# Patient Record
Sex: Female | Born: 1990
Health system: Southern US, Community
[De-identification: ages and names within clinical notes are randomized; demographics above are authoritative.]

## PROBLEM LIST (undated history)

## (undated) ENCOUNTER — Inpatient Hospital Stay: Payer: Self-pay

## (undated) DIAGNOSIS — D649 Anemia, unspecified: Secondary | ICD-10-CM

## (undated) DIAGNOSIS — E785 Hyperlipidemia, unspecified: Secondary | ICD-10-CM

## (undated) DIAGNOSIS — F32A Depression, unspecified: Secondary | ICD-10-CM

## (undated) DIAGNOSIS — O24419 Gestational diabetes mellitus in pregnancy, unspecified control: Secondary | ICD-10-CM

## (undated) DIAGNOSIS — G4700 Insomnia, unspecified: Secondary | ICD-10-CM

## (undated) DIAGNOSIS — F329 Major depressive disorder, single episode, unspecified: Secondary | ICD-10-CM

## (undated) HISTORY — DX: Hyperlipidemia, unspecified: E78.5

## (undated) HISTORY — DX: Depression, unspecified: F32.A

## (undated) HISTORY — DX: Insomnia, unspecified: G47.00

## (undated) HISTORY — DX: Major depressive disorder, single episode, unspecified: F32.9

## (undated) HISTORY — DX: Anemia, unspecified: D64.9

## (undated) HISTORY — PX: TONSILLECTOMY: SUR1361

## (undated) HISTORY — PX: TONSILLECTOMY: SHX5217

## (undated) HISTORY — PX: CHOLECYSTECTOMY: SHX55

## (undated) SURGERY — Surgical Case
Anesthesia: Spinal | Laterality: Bilateral

---
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
## 2008-03-14 ENCOUNTER — Observation Stay: Payer: Self-pay | Admitting: Unknown Physician Specialty

## 2008-03-17 ENCOUNTER — Ambulatory Visit: Payer: Self-pay | Admitting: Obstetrics & Gynecology

## 2008-03-21 ENCOUNTER — Inpatient Hospital Stay: Payer: Self-pay | Admitting: Obstetrics & Gynecology

## 2009-04-08 ENCOUNTER — Emergency Department: Payer: Self-pay | Admitting: Emergency Medicine

## 2009-04-29 ENCOUNTER — Emergency Department: Payer: Self-pay | Admitting: Emergency Medicine

## 2009-05-18 ENCOUNTER — Ambulatory Visit: Payer: Self-pay | Admitting: Surgery

## 2009-05-22 ENCOUNTER — Ambulatory Visit: Payer: Self-pay | Admitting: Surgery

## 2009-10-05 ENCOUNTER — Emergency Department (HOSPITAL_COMMUNITY): Admission: EM | Admit: 2009-10-05 | Discharge: 2009-10-06 | Payer: Self-pay | Admitting: Emergency Medicine

## 2010-10-07 LAB — DIFFERENTIAL
Basophils Absolute: 0.2 10*3/uL — ABNORMAL HIGH (ref 0.0–0.1)
Basophils Relative: 1 % (ref 0–1)
Eosinophils Absolute: 0.4 10*3/uL (ref 0.0–0.7)
Eosinophils Relative: 3 % (ref 0–5)
Lymphs Abs: 3.3 10*3/uL (ref 0.7–4.0)
Monocytes Absolute: 0.6 10*3/uL (ref 0.1–1.0)
Monocytes Relative: 4 % (ref 3–12)
Neutrophils Relative %: 68 % (ref 43–77)

## 2010-10-07 LAB — URINALYSIS, ROUTINE W REFLEX MICROSCOPIC
Bilirubin Urine: NEGATIVE
Glucose, UA: NEGATIVE mg/dL
Leukocytes, UA: NEGATIVE
Nitrite: NEGATIVE
Specific Gravity, Urine: 1.021 (ref 1.005–1.030)
pH: 6 (ref 5.0–8.0)

## 2010-10-07 LAB — RHOGAM INJECTION

## 2010-10-07 LAB — RPR: RPR Ser Ql: NONREACTIVE

## 2010-10-07 LAB — CBC: WBC: 13.9 10*3/uL — ABNORMAL HIGH (ref 4.0–10.5)

## 2010-10-07 LAB — GC/CHLAMYDIA PROBE AMP, GENITAL: Chlamydia, DNA Probe: NEGATIVE

## 2010-10-07 LAB — HCG, QUANTITATIVE, PREGNANCY: hCG, Beta Chain, Quant, S: 479 m[IU]/mL — ABNORMAL HIGH (ref <5)

## 2010-10-07 LAB — WET PREP, GENITAL: Yeast Wet Prep HPF POC: NONE SEEN

## 2010-10-07 LAB — URINE MICROSCOPIC-ADD ON

## 2011-11-24 ENCOUNTER — Emergency Department: Payer: Self-pay | Admitting: *Deleted

## 2011-11-24 LAB — URINALYSIS, COMPLETE
Bacteria: NONE SEEN
Bilirubin,UR: NEGATIVE
Glucose,UR: NEGATIVE mg/dL (ref 0–75)
Ketone: NEGATIVE
Leukocyte Esterase: NEGATIVE
Squamous Epithelial: 4
WBC UR: 4 /HPF (ref 0–5)

## 2011-11-24 LAB — COMPREHENSIVE METABOLIC PANEL
Albumin: 4.1 g/dL (ref 3.4–5.0)
BUN: 11 mg/dL (ref 7–18)
Calcium, Total: 8.4 mg/dL — ABNORMAL LOW (ref 8.5–10.1)
Chloride: 105 mmol/L (ref 98–107)
Glucose: 128 mg/dL — ABNORMAL HIGH (ref 65–99)
Potassium: 3.4 mmol/L — ABNORMAL LOW (ref 3.5–5.1)
SGOT(AST): 21 U/L (ref 15–37)
Sodium: 140 mmol/L (ref 136–145)
Total Protein: 7.9 g/dL (ref 6.4–8.2)

## 2011-11-24 LAB — CBC
HGB: 13.9 g/dL (ref 12.0–16.0)
MCV: 94 fL (ref 80–100)
Platelet: 229 10*3/uL (ref 150–440)
RDW: 13 % (ref 11.5–14.5)
WBC: 11.6 10*3/uL — ABNORMAL HIGH (ref 3.6–11.0)

## 2011-11-24 LAB — PREGNANCY, URINE: Pregnancy Test, Urine: NEGATIVE m[IU]/mL

## 2011-11-24 LAB — LIPASE, BLOOD: Lipase: 372 U/L (ref 73–393)

## 2012-03-17 ENCOUNTER — Emergency Department: Payer: Self-pay | Admitting: Emergency Medicine

## 2012-03-17 LAB — CBC
HCT: 43.1 % (ref 35.0–47.0)
MCH: 31.3 pg (ref 26.0–34.0)
MCV: 93 fL (ref 80–100)
Platelet: 233 10*3/uL (ref 150–440)
RDW: 13.4 % (ref 11.5–14.5)
WBC: 13.1 10*3/uL — ABNORMAL HIGH (ref 3.6–11.0)

## 2012-03-17 LAB — BASIC METABOLIC PANEL
BUN: 14 mg/dL (ref 7–18)
EGFR (Non-African Amer.): >60
Glucose: 89 mg/dL (ref 65–99)
Potassium: 3.7 mmol/L (ref 3.5–5.1)
Sodium: 140 mmol/L (ref 136–145)

## 2012-03-17 LAB — HEPATIC FUNCTION PANEL A (ARMC)
Albumin: 4.2 g/dL (ref 3.4–5.0)
Alkaline Phosphatase: 72 U/L (ref 50–136)
Bilirubin, Direct: 0.1 mg/dL (ref 0.00–0.20)
Bilirubin,Total: 0.7 mg/dL (ref 0.2–1.0)
Total Protein: 8.3 g/dL — ABNORMAL HIGH (ref 6.4–8.2)

## 2012-03-17 LAB — LIPASE, BLOOD: Lipase: 219 U/L (ref 73–393)

## 2012-03-17 LAB — URINALYSIS, COMPLETE
Bacteria: NONE SEEN
Bilirubin,UR: NEGATIVE
Ketone: NEGATIVE
Leukocyte Esterase: NEGATIVE
Nitrite: NEGATIVE
Protein: NEGATIVE
RBC,UR: 3 /HPF (ref 0–5)
Specific Gravity: 1.027 (ref 1.003–1.030)
WBC UR: 3 /HPF (ref 0–5)

## 2014-10-22 ENCOUNTER — Emergency Department
Admit: 2014-10-22 | Disposition: A | Discharge status: Home / Self Care | Payer: Self-pay | Admitting: Emergency Medicine

## 2014-10-22 LAB — CBC
HCT: 41.5 % (ref 35.0–47.0)
HGB: 13.7 g/dL (ref 12.0–16.0)
MCH: 30.5 pg (ref 26.0–34.0)
MCHC: 33.1 g/dL (ref 32.0–36.0)
MCV: 92 fL (ref 80–100)
PLATELETS: 208 10*3/uL (ref 150–440)
RBC: 4.5 10*6/uL (ref 3.80–5.20)
RDW: 13 % (ref 11.5–14.5)
WBC: 12.4 10*3/uL — AB (ref 3.6–11.0)

## 2014-10-22 LAB — HCG, QUANTITATIVE, PREGNANCY: BETA HCG, QUANT.: 23338 m[IU]/mL — AB

## 2015-04-20 ENCOUNTER — Encounter: Payer: Self-pay | Admitting: *Deleted

## 2015-04-20 ENCOUNTER — Encounter: Payer: Medicaid Other | Attending: Certified Nurse Midwife | Admitting: *Deleted

## 2015-04-20 VITALS — BP 100/68 | Ht 59.0 in | Wt 184.6 lb

## 2015-04-20 DIAGNOSIS — O24419 Gestational diabetes mellitus in pregnancy, unspecified control: Secondary | ICD-10-CM | POA: Diagnosis present

## 2015-04-20 DIAGNOSIS — O24410 Gestational diabetes mellitus in pregnancy, diet controlled: Secondary | ICD-10-CM

## 2015-04-20 NOTE — Patient Instructions (Signed)
Read booklet on Gestational Diabetes Follow Gestational Meal Planning Guidelines Avoid cold cereal for breakfast Complete a 3 Day Food Record and bring to next appointment Check blood sugars 4 x day - before breakfast and 2 hrs after every meal and record  Call MD for prescription for meter strips and lancets Strips Accu-Chek Aviva Lancets   Accu-Chek Fastclix Bring blood sugar log to next appointment If blood sugars not within guidelines - purchase urine ketone strips and check urine ketones every am:  If + increase bedtime snack to 1 protein and 2 carbohydrate servings Walk 20-30 minutes at least 5 x week if permitted by MD

## 2015-04-20 NOTE — Progress Notes (Signed)
Diabetes Self-Management Education  Visit Type: First/Initial  Appt. Start Time: 0820 Appt. End Time: 1005  04/20/2015  Ms. Rebecca Ponce, identified by name and date of birth, is a 24 y.o. female with a diagnosis of Diabetes: Gestational Diabetes.   ASSESSMENT  Blood pressure 100/68, height  (1.499 m), weight 184 lb 9.6 oz (83.734 kg), last menstrual period 09/12/2014. Body mass index is 37.26 kg/(m^2).      Diabetes Self-Management Education - 04/20/15 1213    Visit Information   Visit Type First/Initial   Initial Visit   Diabetes Type Gestational Diabetes   Are you currently following a meal plan? No   Are you taking your medications as prescribed? Yes   Date Diagnosed last week   Health Coping   How would you rate your overall health? Fair   Psychosocial Assessment   Patient Belief/Attitude about Diabetes Other (comment)  pt reports "figured I would have diabetes with my family history"   Self-care barriers None   Self-management support Family;Doctor's office   Patient Concerns Nutrition/Meal planning;Weight Control;Healthy Lifestyle;Monitoring   Special Needs None   Preferred Learning Style Hands on;Visual   Learning Readiness Ready   How often do you need to have someone help you when you read instructions, pamphlets, or other written materials from your doctor or pharmacy? 1 - Never   Complications   How often do you check your blood sugar? 0 times/day (not testing)  Provided Accu-Chek Aviva Connect meter and instructed on use. BG upon return demonstration was 93 mg/dL at 16:10 am - 4 hrs pp.   Have you had a dilated eye exam in the past 12 months? No   Have you had a dental exam in the past 12 months? No   Are you checking your feet? No   Dietary Intake   Breakfast oatmeal, grits, cold cereal, eggs and sausage   Lunch sandwich   Snack (afternoon) granola bar   Dinner chicken, spaghetti, pork chop, hot dogs, hamburgers, corn, peas, potatoes   Beverage(s)  water   Exercise   Exercise Type ADL's   Patient Education   Previous Diabetes Education Yes (please comment)  from her mother whom has diabetes   Disease state  Definition of diabetes, type 1 and 2, and the diagnosis of diabetes;Factors that contribute to the development of diabetes   Nutrition management  Role of diet in the treatment of diabetes and the relationship between the three main macronutrients and blood glucose level   Physical activity and exercise  Role of exercise on diabetes management, blood pressure control and cardiac health.   Monitoring Taught/evaluated SMBG meter.;Purpose and frequency of SMBG.;Identified appropriate SMBG and/or A1C goals.   Chronic complications Relationship between chronic complications and blood glucose control   Psychosocial adjustment Identified and addressed patients feelings and concerns about diabetes   Preconception care Pregnancy and GDM  Role of pre-pregnancy blood glucose control on the development of the fetus;Reviewed with patient blood glucose goals with pregnancy   Individualized Goals (developed by patient)   Reducing Risk Improve blood sugars Lose weight Lead a healthier lifestyle Become more fit   Outcomes   Expected Outcomes Demonstrated interest in learning. Expect positive outcomes      Individualized Plan for Diabetes Self-Management Training:   Learning Objective:  Patient will have a greater understanding of diabetes self-management. Patient education plan is to attend individual and/or group sessions per assessed needs and concerns.   Plan:   Patient Instructions  Read booklet on  Gestational Diabetes Follow Gestational Meal Planning Guidelines Avoid cold cereal for breakfast Complete a 3 Day Food Record and bring to next appointment Check blood sugars 4 x day - before breakfast and 2 hrs after every meal and record  Call MD for prescription for meter strips and lancets Strips Accu-Chek Aviva Lancets   Accu-Chek  Fastclix Bring blood sugar log to next appointment If blood sugars not within guidelines - purchase urine ketone strips and check urine ketones every am:  If + increase bedtime snack to 1 protein and 2 carbohydrate servings Walk 20-30 minutes at least 5 x week if permitted by MD   Expected Outcomes:  Demonstrated interest in learning. Expect positive outcomes  Education material provided:  Gestational Booklet Gestational Meal Planning Guidelines Viewed Gestational Diabetes Video Meter - Accu-Chek Aviva Connect 3 Day Food Record Goals for a Healthy Pregnancy   If problems or questions, patient to contact team via:   Sharion Settler, RN, CCM, CDE [phone]  Future DSME appointment:  Tuesday April 24, 2015 with Pam (dietitian)

## 2015-04-24 ENCOUNTER — Encounter: Payer: Medicaid Other | Admitting: Dietician

## 2015-04-24 VITALS — BP 120/64 | Ht 59.0 in | Wt 185.3 lb

## 2015-04-24 DIAGNOSIS — O24410 Gestational diabetes mellitus in pregnancy, diet controlled: Secondary | ICD-10-CM

## 2015-04-24 DIAGNOSIS — O24419 Gestational diabetes mellitus in pregnancy, unspecified control: Secondary | ICD-10-CM | POA: Diagnosis not present

## 2015-04-24 NOTE — Patient Instructions (Signed)
   Carefully control carbohydrate portions with meals, keep to 3 servings at a time (2 at breakfast), which is 30-45grams.   Include protein and "free" veggies to make sure you get enough to feel satisfied after eating.

## 2015-04-24 NOTE — Progress Notes (Signed)
   Patient's BG record indicates BGs elevated: FBGs ranging 97-112, and post-meal BGs 75-158.   Patient's food diary indicates some high-carb meals, otherwise well-balanced.   Provided 1700kcal meal plan, and wrote individualized menus based on patient's food preferences.   Advised patient to control carbohydrate portions, and to increase low-carb vegetables and protein portions if needed to control hunger.   Instructed patient on food safety, including avoidance of Listeriosis, and limiting mercury from fish (patient states she does not eat fish).  Discussed importance of maintaining healthy lifestyle habits to reduce risk of Type 2 DM as well as Gestational DM with any future pregnancies.  Advised patient to use any remaining testing supplies to test some BGs after delivery, and to have BG tested ideally annually, as well as prior to attempting future pregnancies.

## 2015-05-25 ENCOUNTER — Inpatient Hospital Stay
Admission: EM | Admit: 2015-05-25 | Discharge: 2015-05-25 | Disposition: A | Discharge status: Home / Self Care | Payer: Medicaid Other | Attending: Obstetrics and Gynecology | Admitting: Obstetrics and Gynecology

## 2015-05-25 ENCOUNTER — Encounter: Payer: Self-pay | Admitting: *Deleted

## 2015-05-25 HISTORY — DX: Gestational diabetes mellitus in pregnancy, unspecified control: O24.419

## 2015-05-25 NOTE — Progress Notes (Signed)
Pt. Discharge to home with family at the bedside. Pt. Pain 0/10, unaware of intermittent contractions. FHR with baseline of 155 bpm. Pt. Verbalized understanding of all discharge instructions, please return to hospital if temp 100.4 or greater, vaginal bleeding, SROM or decreased fetal movement.  Pt. Verbalized understanding.

## 2015-05-25 NOTE — OB Triage Note (Signed)
Pt. Sent from the office by Dr. Vergie LivingPickens for NST - "office NST was taking too long".

## 2015-05-25 NOTE — Progress Notes (Signed)
L&D Triage Note  24 year old G4 P2012 with EDC=06/19/2015 by LMP +5wk6d ultrasound presents from office at 36.3 weeks for NST. In office NST for GDMA2 was non reactive. [facility] also remarkable for prior Cesarean sections x 2 and has A negative blood type.   Exam: General : in no acute distress BP 133/70 mmHg  Pulse 114  Temp(Src) 98.8 F (37.1 C) (Oral)  Resp 18  Ht 4\' 11"  (1.499 m)  Wt 192 lb (87.091 kg)  BMI 38.76 kg/m2  LMP 09/12/2014 (Approximate)   FHR 155 with accels to 180s, moderate variability Toco: occasional contractions  A: IUP at 36.3 weeks with nicely reactive NST  P: DC home Daily FKCs NST/AFI as scheduled on 15 November.  Farrel ConnersGUTIERREZ, Rebecca Ponce, CNM

## 2015-06-01 ENCOUNTER — Observation Stay
Admission: EM | Admit: 2015-06-01 | Discharge: 2015-06-01 | Disposition: A | Discharge status: Home / Self Care | Payer: Medicaid Other | Attending: Obstetrics and Gynecology | Admitting: Obstetrics and Gynecology

## 2015-06-01 DIAGNOSIS — Z3A37 37 weeks gestation of pregnancy: Secondary | ICD-10-CM | POA: Insufficient documentation

## 2015-06-01 DIAGNOSIS — O288 Other abnormal findings on antenatal screening of mother: Secondary | ICD-10-CM | POA: Diagnosis present

## 2015-06-01 NOTE — OB Triage Note (Signed)
Arrived per Wc to Obs 3 c/o nonreactive NST in office today.  Changed to gown and to bed.  EFM applied.  Isntructions given concerning NST.  Demonstrated understanding.  Oriented to room and plan of care discussed.  Agrees with plan of care.

## 2015-06-01 NOTE — OB Triage Note (Signed)
Pt arrived for NST and changed into gown. Pt hooked up to US and Toco and given instructions about monitoring. Pt verbalizes understanding.

## 2015-06-01 NOTE — Plan of Care (Signed)
Discharge instructions given and explained.  Questions answered.  Signed copy in hand and one on chart.  Discharged in stable and ambulatory condition.

## 2015-06-01 NOTE — Progress Notes (Signed)
L&D Triage Note  24 year old G4 P2012 with EDC=06/19/2015 by LMP +5wk6d ultrasound presents from office at 37.3 weeks for NST. In office NST for GDMA2 was non reactive. [facility] for prior Cesarean sections x 2 and has A negative blood type.   Exam: General : in no acute distress, not feeling contractions  FHR 155 with accels to 170s, moderate variability Toco:irregular, mild contractions  A: IUP at 37.3 weeks with nicely reactive NST  P: DC home Daily FKCs NST/growth scan as scheduled on 22 November. Repeat CS scheduled on 29 November  Farrel ConnersGUTIERREZ, Rebecca Ponce, [place]

## 2015-06-11 ENCOUNTER — Inpatient Hospital Stay: Payer: Medicaid Other | Admitting: Anesthesiology

## 2015-06-11 ENCOUNTER — Inpatient Hospital Stay
Admission: EM | Admit: 2015-06-11 | Discharge: 2015-06-14 | DRG: 766 | Disposition: A | Discharge status: Home / Self Care | Payer: Medicaid Other | Attending: Obstetrics and Gynecology | Admitting: Obstetrics and Gynecology

## 2015-06-11 ENCOUNTER — Encounter
Admission: EM | Disposition: A | Discharge status: Home / Self Care | Payer: Self-pay | Source: Home / Self Care | Attending: Obstetrics and Gynecology

## 2015-06-11 ENCOUNTER — Encounter
Admission: RE | Admit: 2015-06-11 | Discharge: 2015-06-11 | Disposition: A | Discharge status: Home / Self Care | Payer: Medicaid Other | Source: Ambulatory Visit | Attending: Obstetrics & Gynecology | Admitting: Obstetrics & Gynecology

## 2015-06-11 DIAGNOSIS — Z302 Encounter for sterilization: Secondary | ICD-10-CM | POA: Diagnosis not present

## 2015-06-11 DIAGNOSIS — O34211 Maternal care for low transverse scar from previous cesarean delivery: Secondary | ICD-10-CM | POA: Diagnosis present

## 2015-06-11 DIAGNOSIS — O24415 Gestational diabetes mellitus in pregnancy, controlled by oral hypoglycemic drugs: Secondary | ICD-10-CM | POA: Diagnosis present

## 2015-06-11 DIAGNOSIS — R21 Rash and other nonspecific skin eruption: Secondary | ICD-10-CM | POA: Diagnosis present

## 2015-06-11 DIAGNOSIS — Z3A39 39 weeks gestation of pregnancy: Secondary | ICD-10-CM

## 2015-06-11 DIAGNOSIS — O24429 Gestational diabetes mellitus in childbirth, unspecified control: Secondary | ICD-10-CM | POA: Diagnosis present

## 2015-06-11 DIAGNOSIS — O4292 Full-term premature rupture of membranes, unspecified as to length of time between rupture and onset of labor: Secondary | ICD-10-CM | POA: Diagnosis present

## 2015-06-11 DIAGNOSIS — Z98891 History of uterine scar from previous surgery: Secondary | ICD-10-CM

## 2015-06-11 LAB — CBC
HCT: 43.9 % (ref 35.0–47.0)
Hemoglobin: 14.4 g/dL (ref 12.0–16.0)
MCH: 30.5 pg (ref 26.0–34.0)
MCHC: 32.8 g/dL (ref 32.0–36.0)
MCV: 93 fL (ref 80.0–100.0)
PLATELETS: 128 10*3/uL — AB (ref 150–440)
RBC: 4.73 MIL/uL (ref 3.80–5.20)
RDW: 14.2 % (ref 11.5–14.5)
WBC: 11.3 10*3/uL — ABNORMAL HIGH (ref 3.6–11.0)

## 2015-06-11 LAB — TYPE AND SCREEN
ABO/RH(D): A NEG
ANTIBODY SCREEN: NEGATIVE
Extend sample reason: UNDETERMINED

## 2015-06-11 LAB — ABO/RH: ABO/RH(D): A NEG

## 2015-06-11 SURGERY — Surgical Case
Anesthesia: Spinal

## 2015-06-11 MED ORDER — PHENYLEPHRINE HCL 10 MG/ML IJ SOLN
INTRAMUSCULAR | Status: DC | PRN
  Administered 2015-06-11: 100 ug via INTRAVENOUS

## 2015-06-11 MED ORDER — EPHEDRINE SULFATE 50 MG/ML IJ SOLN
INTRAMUSCULAR | Status: DC | PRN
  Administered 2015-06-11 (×2): 10 mg via INTRAVENOUS

## 2015-06-11 MED ORDER — CITRIC ACID-SODIUM CITRATE 334-500 MG/5ML PO SOLN: 30.0000 mL | ORAL | Status: DC

## 2015-06-11 MED ORDER — KETOROLAC TROMETHAMINE 30 MG/ML IJ SOLN
30.0000 mg | Freq: Four times a day (QID) | INTRAMUSCULAR | Status: AC | PRN
  Administered 2015-06-12: 30 mg via INTRAVENOUS
  Filled 2015-06-11: qty 1

## 2015-06-11 MED ORDER — KETOROLAC TROMETHAMINE 30 MG/ML IJ SOLN: 30.0000 mg | Freq: Four times a day (QID) | INTRAMUSCULAR | Status: AC | PRN

## 2015-06-11 MED ORDER — CEFAZOLIN SODIUM-DEXTROSE 2-3 GM-% IV SOLR
INTRAVENOUS | Status: AC
  Administered 2015-06-11: 2 g via INTRAVENOUS
  Filled 2015-06-11: qty 50

## 2015-06-11 MED ORDER — PHENYLEPHRINE HCL 10 MG/ML IJ SOLN: INTRAMUSCULAR | Status: DC | PRN

## 2015-06-11 MED ORDER — CEFAZOLIN SODIUM-DEXTROSE 2-3 GM-% IV SOLR: 2.0000 g | INTRAVENOUS | Status: DC

## 2015-06-11 MED ORDER — MORPHINE SULFATE (PF) 0.5 MG/ML IJ SOLN
INTRAMUSCULAR | Status: DC | PRN
  Administered 2015-06-11: .2 mg via EPIDURAL

## 2015-06-11 MED ORDER — LACTATED RINGERS IV SOLN
INTRAVENOUS | Status: DC
  Administered 2015-06-11 (×3): via INTRAVENOUS

## 2015-06-11 SURGICAL SUPPLY — 27 items
BAG COUNTER SPONGE EZ (MISCELLANEOUS) ×4 IMPLANT
CANISTER SUCT 3000ML (MISCELLANEOUS) ×3 IMPLANT
CATH KIT ON-Q SILVERSOAK 5IN (CATHETERS) IMPLANT
CHLORAPREP W/TINT 26ML (MISCELLANEOUS) ×6 IMPLANT
CLOSURE WOUND 1/2 X4 (GAUZE/BANDAGES/DRESSINGS) ×1
COUNTER SPONGE BAG EZ (MISCELLANEOUS) ×2
DRSG TELFA 3X8 NADH (GAUZE/BANDAGES/DRESSINGS) ×3 IMPLANT
ELECT CAUTERY BLADE 6.4 (BLADE) ×3 IMPLANT
GAUZE SPONGE 4X4 12PLY STRL (GAUZE/BANDAGES/DRESSINGS) ×3 IMPLANT
GLOVE BIO SURGEON STRL SZ7 (GLOVE) ×3 IMPLANT
GLOVE INDICATOR 7.5 STRL GRN (GLOVE) ×3 IMPLANT
GOWN STRL REUS W/ TWL LRG LVL3 (GOWN DISPOSABLE) ×3 IMPLANT
GOWN STRL REUS W/TWL LRG LVL3 (GOWN DISPOSABLE) ×6
LIQUID BAND (GAUZE/BANDAGES/DRESSINGS) ×3 IMPLANT
NS IRRIG 1000ML POUR BTL (IV SOLUTION) ×3 IMPLANT
PACK C SECTION AR (MISCELLANEOUS) ×3 IMPLANT
PAD GROUND ADULT SPLIT (MISCELLANEOUS) ×3 IMPLANT
PAD OB MATERNITY 4.3X12.25 (PERSONAL CARE ITEMS) IMPLANT
PAD PREP 24X41 OB/GYN DISP (PERSONAL CARE ITEMS) ×3 IMPLANT
STRIP CLOSURE SKIN 1/2X4 (GAUZE/BANDAGES/DRESSINGS) ×2 IMPLANT
SUT MNCRL AB 4-0 PS2 18 (SUTURE) ×6 IMPLANT
SUT PDS AB 1 TP1 96 (SUTURE) ×3 IMPLANT
SUT PLAIN 2 0 XLH (SUTURE) ×3 IMPLANT
SUT SILK 2 0 REEL (SUTURE) ×3 IMPLANT
SUT VIC AB 0 CTX 36 (SUTURE) ×4
SUT VIC AB 0 CTX36XBRD ANBCTRL (SUTURE) ×2 IMPLANT
SUT VIC AB 2-0 CT1 36 (SUTURE) ×3 IMPLANT

## 2015-06-11 NOTE — Anesthesia Procedure Notes (Signed)
Spinal Patient location during procedure: OB Start time: 06/11/2015 11:15 PM End time: 06/11/2015 11:18 PM Staffing Anesthesiologist: Yves DillARROLL, Shila Kruczek Performed by: anesthesiologist  Preanesthetic Checklist Completed: patient identified, site marked, surgical consent, pre-op evaluation, timeout performed, IV checked, risks and benefits discussed and monitors and equipment checked Spinal Block Patient position: sitting Prep: Betadine and site prepped and draped Patient monitoring: heart rate, cardiac monitor, continuous pulse ox and blood pressure Approach: midline Location: L3-4 Injection technique: single-shot Needle Needle type: Whitacre  Needle gauge: 25 G Assessment Sensory level: T4 Additional Notes Time out called.  Patient placed in sitting position and spinal as above.  Skin wheal made with 1% Lidocaine plain.  Good return of clear, colorless CSF in all 4 quadrants.  12 mg of spinal marcaine with Epi 1: 200K and 0.2 mg of astromorph.  Patient tolerated the procedure well with no paresthesias

## 2015-06-11 NOTE — Anesthesia Preprocedure Evaluation (Signed)
Anesthesia Evaluation  Patient identified by MRN, date of birth, ID band Patient awake    Reviewed: Allergy & Precautions, NPO status , Patient's Chart, lab work & pertinent test results  Airway Mallampati: II  TM Distance: >3 FB Neck ROM: Full    Dental no notable dental hx.    Pulmonary neg pulmonary ROS,    Pulmonary exam normal breath sounds clear to auscultation       Cardiovascular negative cardio ROS Normal cardiovascular exam     Neuro/Psych negative neurological ROS  negative psych ROS   GI/Hepatic negative GI ROS, Neg liver ROS,   Endo/Other  diabetes, Well Controlled, Gestational, Oral Hypoglycemic Agents  Renal/GU negative Renal ROS  negative genitourinary   Musculoskeletal negative musculoskeletal ROS (+)   Abdominal Normal abdominal exam  (+)   Peds negative pediatric ROS (+)  Hematology negative hematology ROS (+)   Anesthesia Other Findings   Reproductive/Obstetrics (+) Pregnancy                             Anesthesia Physical Anesthesia Plan  ASA: II  Anesthesia Plan: Spinal   Post-op Pain Management: MAC Combined w/ Regional for Post-op pain   Induction:   Airway Management Planned: Nasal Cannula  Additional Equipment:   Intra-op Plan:   Post-operative Plan:   Informed Consent: I have reviewed the patients History and Physical, chart, labs and discussed the procedure including the risks, benefits and alternatives for the proposed anesthesia with the patient or authorized representative who has indicated his/her understanding and acceptance.   Dental advisory given  Plan Discussed with: CRNA and Surgeon  Anesthesia Plan Comments:         Anesthesia Quick Evaluation

## 2015-06-11 NOTE — Progress Notes (Signed)
             Rebecca MountainKristy M Ponce   Your procedure is scheduled on: 06/12/15   Report to [facility] at 5:00.             [facility]             [address]             [place], [place] [address]  Call this number if you have problems the morning of surgery: [phone]   Remember:   Do not eat food or drink liquids after midnight.     Do not wear jewelry, make-up or nail polish.  Do not wear lotions, powders, or perfumes. You may wear deodorant.  Do not shave prior to surgery.  Do not bring valuables to the hospital.  [facility] is not responsible for any belongings or valuables.                Contacts, dentures or bridgework may not be worn into surgery.  Leave suitcase in the car. After surgery it may be brought to your room.  For patients admitted to the hospital, discharge time is determined by your             treatment team.               Special Instructions: Preparing the skin before Cesarean Section              To help prevent the risk of infection at your surgical site, we are providing             you with rinse-free Sage 2% Chlorhexidine Gluconate (HCG) disposable             wipes.               The night before surgery:              1. Shower or bathe with warm water             2. Do not apply lotion or perfume             3. Wait one hour after shower, skin should be dry and cool             4. Open Sage wipe package - 2 disposable cloths are inside             5. Wipe the lower abdomen from the pubic line to the navel and hip bone to hip             bone with one cloth             6. Use the second cloth to wipe the front of the upper thighs             7. Allow the area to dry for one minute. DO NOT RINSE             8. Skin may feel "tacky" for several minutes             9. Dress in freshly laundered, clean clothes           10. Do not shower the morning of surgery    Please read over the following fact sheets that you were  given: Coughing and            Deep Breathing and Surgical Site Infection Prevention

## 2015-06-11 NOTE — H&P (Signed)
Date of Initial paper H&P: 06/11/2015  History reviewed, patient examined, scheduled for C-section tomorrow presenting with PROM this evening, stable for surgery.  Still desires tubal ligation

## 2015-06-11 NOTE — Progress Notes (Shared)
Rebecca Ponce   Your procedure is scheduled on: date***   Report to [facility] at am/pm***.             [facility]             [address]             [place], [place] [address]  Call this number if you have problems the morning of surgery: [phone]   Remember:   Do not eat food or drink liquids after midnight.     Do not wear jewelry, make-up or nail polish.  Do not wear lotions, powders, or perfumes. You may wear deodorant.  Do not shave prior to surgery.  Do not bring valuables to the hospital.  [facility] is not responsible for any            belongings or valuables.                Contacts, dentures or bridgework may not be worn into surgery.  Leave suitcase in the car. After surgery it may be brought to your room.  For patients admitted to the hospital, discharge time is determined by your             treatment team.               Special Instructions: Preparing the skin before Cesarean Section              To help prevent the risk of infection at your surgical site, we are providing             you with rinse-free Sage 2% Chlorhexidine Gluconate (HCG) disposable             wipes.               The night before surgery:              1. Shower or bathe with warm water             2. Do not apply lotion or perfume             3. Wait one hour after shower, skin should be dry and cool             4. Open Sage wipe package - 2 disposable cloths are inside             5. Wipe the lower abdomen from the pubic line to the navel and hip bone to hip             bone with one cloth             6. Use the second cloth to wipe the front of the upper thighs             7. Allow the area to dry for one minute. DO NOT RINSE             8. Skin may feel "tacky" for several minutes             9. Dress in freshly laundered, clean clothes           10. Do not shower the morning of surgery    Please read over the following fact sheets  that you were given: Coughing and            Deep Breathing and  Surgical Site Infection Prevention

## 2015-06-12 ENCOUNTER — Inpatient Hospital Stay
Admission: RE | Admit: 2015-06-12 | Payer: Medicaid Other | Source: Ambulatory Visit | Admitting: Obstetrics & Gynecology

## 2015-06-12 ENCOUNTER — Encounter
Admission: EM | Disposition: A | Discharge status: Home / Self Care | Payer: Self-pay | Source: Home / Self Care | Attending: Obstetrics and Gynecology

## 2015-06-12 LAB — CBC
HCT: 36.7 % (ref 35.0–47.0)
Hemoglobin: 12.2 g/dL (ref 12.0–16.0)
MCH: 31 pg (ref 26.0–34.0)
MCHC: 33.2 g/dL (ref 32.0–36.0)
MCV: 93.5 fL (ref 80.0–100.0)
PLATELETS: 120 10*3/uL — AB (ref 150–440)
RBC: 3.93 MIL/uL (ref 3.80–5.20)
RDW: 14.1 % (ref 11.5–14.5)
WBC: 16.1 10*3/uL — AB (ref 3.6–11.0)

## 2015-06-12 LAB — RPR: RPR: NONREACTIVE

## 2015-06-12 LAB — HIV ANTIBODY (ROUTINE TESTING W REFLEX): HIV SCREEN 4TH GENERATION: NONREACTIVE

## 2015-06-12 SURGERY — Surgical Case
Anesthesia: Choice

## 2015-06-12 MED ORDER — ACETAMINOPHEN 325 MG PO TABS: 650.0000 mg | ORAL_TABLET | ORAL | Status: DC | PRN

## 2015-06-12 MED ORDER — NALBUPHINE HCL 10 MG/ML IJ SOLN
5.0000 mg | INTRAMUSCULAR | Status: DC | PRN
  Filled 2015-06-12: qty 0.5

## 2015-06-12 MED ORDER — WITCH HAZEL-GLYCERIN EX PADS: 1.0000 "application " | MEDICATED_PAD | CUTANEOUS | Status: DC | PRN

## 2015-06-12 MED ORDER — SENNOSIDES-DOCUSATE SODIUM 8.6-50 MG PO TABS
2.0000 | ORAL_TABLET | ORAL | Status: DC
  Administered 2015-06-14: 2 via ORAL
  Filled 2015-06-12: qty 2

## 2015-06-12 MED ORDER — DIPHENHYDRAMINE HCL 50 MG/ML IJ SOLN
12.5000 mg | INTRAMUSCULAR | Status: DC | PRN
  Administered 2015-06-12: 12.5 mg via INTRAVENOUS
  Filled 2015-06-12: qty 1

## 2015-06-12 MED ORDER — SIMETHICONE 80 MG PO CHEW: 80.0000 mg | CHEWABLE_TABLET | ORAL | Status: DC | PRN

## 2015-06-12 MED ORDER — SIMETHICONE 80 MG PO CHEW
80.0000 mg | CHEWABLE_TABLET | Freq: Three times a day (TID) | ORAL | Status: DC
  Administered 2015-06-12 – 2015-06-14 (×7): 80 mg via ORAL
  Filled 2015-06-12 (×7): qty 1

## 2015-06-12 MED ORDER — NALOXONE HCL 2 MG/2ML IJ SOSY: 1.0000 ug/kg/h | PREFILLED_SYRINGE | INTRAVENOUS | Status: DC | PRN

## 2015-06-12 MED ORDER — SODIUM CHLORIDE 0.9 % IJ SOLN: 3.0000 mL | INTRAMUSCULAR | Status: DC | PRN

## 2015-06-12 MED ORDER — LANOLIN HYDROUS EX OINT: 1.0000 "application " | TOPICAL_OINTMENT | CUTANEOUS | Status: DC | PRN

## 2015-06-12 MED ORDER — LACTATED RINGERS IV SOLN
INTRAVENOUS | Status: DC
  Administered 2015-06-12: 07:00:00 via INTRAVENOUS

## 2015-06-12 MED ORDER — MEPERIDINE HCL 25 MG/ML IJ SOLN: 6.2500 mg | INTRAMUSCULAR | Status: DC | PRN

## 2015-06-12 MED ORDER — OXYTOCIN 40 UNITS IN LACTATED RINGERS INFUSION - SIMPLE MED
INTRAVENOUS | Status: DC | PRN
  Administered 2015-06-12: 200 mL via INTRAVENOUS

## 2015-06-12 MED ORDER — DIPHENHYDRAMINE HCL 25 MG PO CAPS: 25.0000 mg | ORAL_CAPSULE | ORAL | Status: DC | PRN

## 2015-06-12 MED ORDER — DIPHENHYDRAMINE HCL 25 MG PO CAPS: 25.0000 mg | ORAL_CAPSULE | Freq: Four times a day (QID) | ORAL | Status: DC | PRN

## 2015-06-12 MED ORDER — OXYTOCIN 40 UNITS IN LACTATED RINGERS INFUSION - SIMPLE MED: 62.5000 mL/h | INTRAVENOUS | Status: DC

## 2015-06-12 MED ORDER — NALBUPHINE HCL 10 MG/ML IJ SOLN
5.0000 mg | Freq: Once | INTRAMUSCULAR | Status: DC | PRN
  Filled 2015-06-12: qty 0.5

## 2015-06-12 MED ORDER — MENTHOL 3 MG MT LOZG: 1.0000 | LOZENGE | OROMUCOSAL | Status: DC | PRN

## 2015-06-12 MED ORDER — PRENATAL MULTIVITAMIN CH
1.0000 | ORAL_TABLET | Freq: Every day | ORAL | Status: DC
  Administered 2015-06-12 – 2015-06-14 (×3): 1 via ORAL
  Filled 2015-06-12 (×3): qty 1

## 2015-06-12 MED ORDER — SCOPOLAMINE 1 MG/3DAYS TD PT72: 1.0000 | MEDICATED_PATCH | Freq: Once | TRANSDERMAL | Status: DC

## 2015-06-12 MED ORDER — FENTANYL CITRATE (PF) 100 MCG/2ML IJ SOLN: 25.0000 ug | INTRAMUSCULAR | Status: DC | PRN

## 2015-06-12 MED ORDER — DIBUCAINE 1 % RE OINT: 1.0000 "application " | TOPICAL_OINTMENT | RECTAL | Status: DC | PRN

## 2015-06-12 MED ORDER — MORPHINE SULFATE (PF) 2 MG/ML IV SOLN: 1.0000 mg | INTRAVENOUS | Status: DC | PRN

## 2015-06-12 MED ORDER — NALOXONE HCL 0.4 MG/ML IJ SOLN: 0.4000 mg | INTRAMUSCULAR | Status: DC | PRN

## 2015-06-12 MED ORDER — SIMETHICONE 80 MG PO CHEW
80.0000 mg | CHEWABLE_TABLET | ORAL | Status: DC
  Administered 2015-06-14: 80 mg via ORAL
  Filled 2015-06-12: qty 1

## 2015-06-12 MED ORDER — ONDANSETRON HCL 4 MG/2ML IJ SOLN: 4.0000 mg | Freq: Once | INTRAMUSCULAR | Status: DC | PRN

## 2015-06-12 MED ORDER — ONDANSETRON HCL 4 MG/2ML IJ SOLN: 4.0000 mg | Freq: Three times a day (TID) | INTRAMUSCULAR | Status: DC | PRN

## 2015-06-12 NOTE — Transfer of Care (Signed)
Immediate Anesthesia Transfer of Care Note  Patient: Rebecca Ponce  Procedure(s) Performed: Procedure(s): CESAREAN SECTION WITH BILATERAL TUBAL LIGATION (N/A)  Patient Location: PACU  Anesthesia Type:Spinal  Level of Consciousness: awake, alert , oriented and patient cooperative  Airway & Oxygen Therapy: Patient Spontanous Breathing  Post-op Assessment: Report given to RN and Post -op Vital signs reviewed and stable  Post vital signs: Reviewed and stable  Last Vitals:  Filed Vitals:   06/11/15 2216 06/12/15 0019  BP: 120/83 109/62  Pulse: 100 88  Temp: 37.2 C 36.5 C  Resp: 18 21    Complications: No apparent anesthesia complications

## 2015-06-12 NOTE — Anesthesia Postprocedure Evaluation (Signed)
  Anesthesia Post-op Note  Patient: Rebecca MountainKristy M Ponce  Procedure(s) Performed: Procedure(s): CESAREAN SECTION WITH BILATERAL TUBAL LIGATION (N/A)  Anesthesia type:Spinal  Patient location: 345  Post pain: Pain level controlled  Post assessment: Post-op Vital signs reviewed, Patient's Cardiovascular Status Stable, Respiratory Function Stable, Patent Airway and No signs of Nausea or vomiting  Post vital signs: Reviewed and stable  Last Vitals:  Filed Vitals:   06/12/15 0442 06/12/15 0603  BP: 129/60 118/68  Pulse: 88 78  Temp: 37.2 C 36.9 C  Resp: 20 18    Level of consciousness: awake, alert  and patient cooperative  Complications: No apparent anesthesia complications

## 2015-06-12 NOTE — Progress Notes (Addendum)
  Subjective:    Tired, but otherwise doing well. Tolerating regular food. Objective:  Blood pressure 136/69, pulse 90, temperature 99.2 F (37.3 C), temperature source Oral, resp. rate 18, height 4\' 11"  (1.499 m), weight 192 lb (87.091 kg), last menstrual period 09/12/2014, SpO2 98 %, unknown if currently breastfeeding. Adequate urine output-clear yellow General: NAD Heart: RRR without murmur Pulmonary: no increased work of breathing/ CTA Abdomen: non-distended, non-tender, BS active; maculopapular rash (not itchy) Incision: Dsg C+D+I Extremities: no edema, no erythema, no tenderness  Results for orders placed or performed during the hospital encounter of 06/11/15 (from the past 72 hour(s))  CBC     Status: Abnormal   Collection Time: 06/12/15  5:22 AM  Result Value Ref Range   WBC 16.1 (H) 3.6 - 11.0 K/uL   RBC 3.93 3.80 - 5.20 MIL/uL   Hemoglobin 12.2 12.0 - 16.0 g/dL   HCT 16.136.7 09.635.0 - 04.547.0 %   MCV 93.5 80.0 - 100.0 fL   MCH 31.0 26.0 - 34.0 pg   MCHC 33.2 32.0 - 36.0 g/dL   RDW 40.914.1 81.111.5 - 91.414.5 %   Platelets 120 (L) 150 - 440 K/uL   Baby A neg/ Mom A neg  Assessment:   24 y.o. [id number] postoperativeday # 1 (11 hours)  Stable-DC foley this afternoon and the IV this PM GDMA2 delivered   Plan:   1.) Check FBS CBG in AM.  2) Both mom and baby RH neg-Rhogam not indicated  3) Rubella and Varicella nonimmune-vaccinate prior to discharge  4) Breast and bottle feeding  Keaden Gunnoe, CNM

## 2015-06-12 NOTE — Anesthesia Post-op Follow-up Note (Signed)
  Anesthesia Pain Follow-up Note  Patient: Rebecca Ponce  Day #: 1  Date of Follow-up: 06/12/2015 Time: 7:47 AM  Last Vitals:  Filed Vitals:   06/12/15 0442 06/12/15 0603  BP: 129/60 118/68  Pulse: 88 78  Temp: 37.2 C 36.9 C  Resp: 20 18    Level of Consciousness: alert  Pain: none   Side Effects:None  Catheter Site Exam:clean, dry, no drainage  Plan: D/C from anesthesia care  Chong SicilianLopez,  Maudean Hoffmann

## 2015-06-12 NOTE — Discharge Summary (Signed)
Obstetric Discharge Summary Reason for Admission: rupture of membranes Prenatal Procedures: NST Intrapartum Procedures: cesarean: low cervical, transverse and tubal ligation Postpartum Procedures: none Complications-Operative and Postpartum: none HEMOGLOBIN  Date Value Ref Range Status  06/12/2015 12.2 12.0 - 16.0 g/dL Final   HGB  Date Value Ref Range Status  10/22/2014 13.7 12.0-16.0 g/dL Final   HCT  Date Value Ref Range Status  06/12/2015 36.7 35.0 - 47.0 % Final  10/22/2014 41.5 35.0-47.0 % Final    Physical Exam:  General: alert, appears stated age and no distress Lochia: appropriate Uterine Fundus: firm Incision: healing well DVT Evaluation: No evidence of DVT seen on physical exam.  Discharge Diagnoses: Term Pregnancy-delivered  Discharge Information: Date: 06/14/2015 Activity: pelvic rest and no lifting greater than 10lbs for 6 weeks Diet: routine   Medication List    TAKE these medications        glyBURIDE 2.5 MG tablet  Commonly known as:  DIABETA  Take 2.5 mg by mouth daily with breakfast.     glyBURIDE 5 MG tablet  Commonly known as:  DIABETA  Take 5 mg by mouth daily with breakfast.     ibuprofen 600 MG tablet  Commonly known as:  ADVIL,MOTRIN  Take 1 tablet (600 mg total) by mouth every 6 (six) hours as needed for fever or headache.     oxyCODONE-acetaminophen 5-325 MG tablet  Commonly known as:  PERCOCET/ROXICET  Take 1-2 tablets by mouth every 6 (six) hours as needed for moderate pain or severe pain.     PRENATAL MULTIVITAMIN-ULTRA PO  Take 1 tablet by mouth daily.      OK to stop Glyburide  Condition: stable Discharge to: home Follow-up Information    Follow up with Lorrene ReidSTAEBLER, ANDREAS M, MD In 1 week.   Specialty:  Obstetrics and Gynecology   Why:  For Post Op   Contact information:   [address] [place] [address] [phone]       Newborn Data: Live born female  Birth Weight: 9 lb 5.9 oz (4250 g) APGAR: 8,  9  Home with mother.  Lue Sykora PAUL 06/14/2015, 2:46 PM

## 2015-06-12 NOTE — Op Note (Signed)
Preoperative Diagnosis: 1) 24 y.o. [id number] at 6662w0d 2) Desires permanent surgical sterilization 3) History of low transverse C-section x 2 4) Premature rupture of mebranes  Postoperative Diagnosis: 1) 24 y.o. [id number] at 7462w0d 2) Desires permanent surgical sterilization 3) History of low transverse C-section x 2 4) Premature rupture of mebranes  Operation Performed: Repeat low transverse C-section via pfannenstiel skin incision and Pomeroy tubal ligation  Anesthesia: .Choice  Primary Surgeon: Vena AustriaAndreas Tashema Tiller, MD  Assistant: Kandice HamsAmanda Ore  Preoperative Antibiotics: 2g ancef  Estimated Blood Loss: 600mL  IV Fluids: 900mL  Urine Output:: 35mL  Drains or Tubes: Foley to gravity drainage, ON-Q catheter system  Implants: none  Specimens Removed: none  Complications: none  Intraoperative Findings:  Normal tubes ovaries and uterus.  Delivery resulted in the birth of a liveborn female, APGAR (1 MIN): 8   APGAR (5 MINS): 9, weight 9lbs 6oz  Patient Condition: stable  Procedure in Detail:  Patient was taken to the operating room were she was administered regional anesthesia.  She was positioned in the supine position, prepped and draped in the  Usual sterile fashion.  Prior to proceeding with the case a time out was performed and the level of anesthetic was checked and noted to be adequate.  Utilizing the scalpel a pfannenstiel skin incision was made 2cm above the pubic symphysis utilizing the patient's pre-existing scar and carried down sharply to the the level of the rectus fascia.  The fascia was incised in the midline using the scalpel and then extended using mayo scissors.  The superior border of the rectus fascia was grasped with two Kocher clamps and the underlying rectus muscles were dissected of the fascia using blunt dissection.  The median raphae was incised using Mayo scissors.   The inferior border of the rectus fascia was dissected of the rectus muscles in a similar  fashion.  The midline was identified, the peritoneum was entered bluntly and expanded using manual tractions.  The uterus was noted to be in a none rotated position.  Next the bladder blade was placed retracting the bladder caudally.  A bladder flap was not created.  A low transverse incision was scored on the lower uterine segment.  The hysterotomy was entered bluntly using the operators finger.  The hysterotomy incision was extended using manual traction.  The operators hand was placed within the hysterotomy position noting the fetus to be within the OA position.  The vertex was grasped, flexed, brought to the incision, and delivered a traumatically using fundal pressure.  The remainder of the body delivered with ease.  The infant was suctioned, cord was clamped and cut before handing off to the awaiting neonatologist.  The placenta was delivered using manual extraction.  The uterus was exteriorized, wiped clean of clots and debris using two moist laps.  The hysterotomy was closed using a two layer closure of 0 Vicryl, with the first being a running locked, the second a vertical imbricating.  The right tuba was grasped in a mid isthmic portion using a Babcock clamp, before being double suture ligated using a 0 chromic wheel.  The intervening nuckel of tube was excised using Metzenbaum scissors.  Complete cross section of tubal ostia visualized, and noted to be hemostatic  This procedure was then repeated in similar fashion for the patient left tube.    The uterus was returned to the abdomen.  The peritoneal gutters were wiped clean of clots and debris using two moist laps.  The hysterotomy incision and tubal  pedicles were re-inspected noted to be hemostatic.  The rectus muscles were re-approximated in the midline using a single 2-0 Vicryl mattress stitch.  The rectus muscles were inspected noted to be hemostatic.  The fascia was closed using a looped #1 PDS in a running fashion taking 1cm by 1cm bites.  The  subcutaneous tissue was irrigated using warm saline, hemostasis achieved using the bovis.  The subcutaneous dead space was greater than 3cm and was closed.  The subcutaneous dead space was obliterated by using a 53-T 0 Chromic in a running fashion.  The skin was closed using 4-0 Monocryl in a subcuticular fashion.  Sponge needle and instrument counts were corrects times two.  The patient tolerated the procedure well and was taken to the recovery room in stable condition.

## 2015-06-13 LAB — GLUCOSE, CAPILLARY: GLUCOSE-CAPILLARY: 116 mg/dL — AB (ref 65–99)

## 2015-06-13 LAB — SURGICAL PATHOLOGY

## 2015-06-13 MED ORDER — IBUPROFEN 600 MG PO TABS
600.0000 mg | ORAL_TABLET | Freq: Four times a day (QID) | ORAL | Status: DC | PRN
  Administered 2015-06-13 – 2015-06-14 (×6): 600 mg via ORAL
  Filled 2015-06-13 (×7): qty 1

## 2015-06-13 MED ORDER — OXYCODONE-ACETAMINOPHEN 5-325 MG PO TABS
1.0000 | ORAL_TABLET | Freq: Four times a day (QID) | ORAL | Status: DC | PRN
  Administered 2015-06-13 (×3): 2 via ORAL
  Administered 2015-06-13 – 2015-06-14 (×2): 1 via ORAL
  Filled 2015-06-13: qty 2
  Filled 2015-06-13 (×2): qty 1
  Filled 2015-06-13 (×2): qty 2

## 2015-06-13 NOTE — Lactation Note (Signed)
This note was copied from the chart of Rebecca Ponce. Lactation Consultation Note  Patient Name: Rebecca Ponce ZOXWR'UToday's Date: 06/13/2015 Reason for consult: Follow-up assessment   Maternal Data  Mom sleepy and looks very sad. She was learning how to apply nipple shield and frustrated with brief attempt. NS not needed on right as much as it is needed on left, so I told her not to worry about using it there. It will get easier with practice (if still needed after today). Mom c/o sore right nipple from pumping. No trauma seen. I asked her to call LC next time she pumps so we can assess cause of pain. Despite baby doing about as well as earlier today, Mom didn't think baby did "well". She nursed well with curved tip syringe providing volume and flow. When Mom and baby looked too tired, I had her stop and finish with slow flow bottle. I suggested her main focus over night be to pump every 3 hours andbottle feed/snuggle with baby and rest. LC to F/U with breastfeeds again tomorrow.   Feeding Feeding Type: Breast Milk with Formula added Nipple Type: Slow - flow Length of feed: 15 min Baby breastfed well after applying NS and flanging lips outward while using curved tip syringe at breast (volume and flow encouraged her to nurse the best). She is a little sleepier than last feeding, so Mom finished last 5 ml of formula via slow flow bottle. Baby took in a total of 25 ml formula and 4 ml colostrum.  LATCH Score/Interventions Latch: Grasps breast easily, tongue down, lips flanged, rhythmical sucking.  Audible Swallowing: A few with stimulation  Type of Nipple: Everted at rest and after stimulation Intervention(s): Double electric pump (24 mm nipple shield)  Comfort (Breast/Nipple): Soft / non-tender     Hold (Positioning): Assistance needed to correctly position infant at breast and maintain latch. Intervention(s): Support Pillows  LATCH Score: 8  Lactation Tools Discussed/Used Tools:  Nipple Dorris CarnesShields;Bottle Nipple shield size: 24   Consult Status Consult Status: Follow-up Date: 06/14/15 Follow-up type: In-patient    Rebecca Ponce 06/13/2015, 3:15 PM

## 2015-06-13 NOTE — Progress Notes (Signed)
Admit Date: 06/11/2015 Today's Date: 06/13/2015  Subjective: Postpartum Day 1+: Cesarean Delivery Patient reports tolerating PO, + flatus and no problems voiding.    Objective: Vital signs in last 24 hours: Temp:  [98 F (36.7 C)-98.8 F (37.1 C)] 98.8 F (37.1 C) (11/30 0730) Pulse Rate:  [83-110] 90 (11/30 0730) Resp:  [18-20] 20 (11/30 0730) BP: (117-138)/(53-74) 117/53 mmHg (11/30 0730) SpO2:  [99 %] 99 % (11/30 0454)  Physical Exam:  General: alert, cooperative and no distress Lochia: appropriate Uterine Fundus: firm Incision: healing well DVT Evaluation: No evidence of DVT seen on physical exam.   Recent Labs  06/11/15 1228 06/12/15 0522  HGB 14.4 12.2  HCT 43.9 36.7    Assessment/Plan: Status post Cesarean section. Doing well postoperatively.  Continue current care. S/p BTL  Rebecca Ponce 06/13/2015, 10:36 AM

## 2015-06-14 DIAGNOSIS — Z98891 History of uterine scar from previous surgery: Secondary | ICD-10-CM

## 2015-06-14 LAB — GLUCOSE, CAPILLARY: GLUCOSE-CAPILLARY: 70 mg/dL (ref 65–99)

## 2015-06-14 MED ORDER — IBUPROFEN 600 MG PO TABS: 600.0000 mg | ORAL_TABLET | Freq: Four times a day (QID) | ORAL | Status: DC | PRN

## 2015-06-14 MED ORDER — MEASLES, MUMPS & RUBELLA VAC ~~LOC~~ INJ
0.5000 mL | INJECTION | Freq: Once | SUBCUTANEOUS | Status: AC
  Administered 2015-06-14: 0.5 mL via SUBCUTANEOUS
  Filled 2015-06-14: qty 0.5

## 2015-06-14 MED ORDER — VARICELLA VIRUS VACCINE LIVE 1350 PFU/0.5ML IJ SUSR
0.5000 mL | Freq: Once | INTRAMUSCULAR | Status: AC
  Administered 2015-06-14: 0.5 mL via SUBCUTANEOUS
  Filled 2015-06-14: qty 0.5

## 2015-06-14 MED ORDER — OXYCODONE-ACETAMINOPHEN 5-325 MG PO TABS: 1.0000 | ORAL_TABLET | Freq: Four times a day (QID) | ORAL | Status: DC | PRN

## 2015-06-14 NOTE — Progress Notes (Signed)
Admit Date: 06/11/2015 Today's Date: 06/14/2015  Subjective: Postpartum Day 2+: Cesarean Delivery Patient reports tolerating PO, + flatus and no problems voiding.    Objective: Vital signs in last 24 hours: Temp:  [98.3 F (36.8 C)-99 F (37.2 C)] 99 F (37.2 C) (12/01 1152) Pulse Rate:  [92-102] 92 (12/01 0747) Resp:  [18-20] 20 (12/01 0747) BP: (133-145)/(80-83) 133/83 mmHg (12/01 0747)  Physical Exam:  General: alert, cooperative and no distress Lochia: appropriate Uterine Fundus: firm Incision: healing well DVT Evaluation: No evidence of DVT seen on physical exam.   Recent Labs  06/12/15 0522  HGB 12.2  HCT 36.7    Assessment/Plan: Status post Cesarean section. Doing well postoperatively.  Continue current care. S/p BTL  Keora Eccleston PAUL 06/14/2015, 2:43 PM

## 2015-06-14 NOTE — Discharge Instructions (Signed)
Cesarean Delivery, Care After Refer to this sheet in the next few weeks. These instructions provide you with information on caring for yourself after your procedure. Your health care provider may also give you specific instructions. Your treatment has been planned according to current medical practices, but problems sometimes occur. Call your health care provider if you have any problems or questions after you go home. HOME CARE INSTRUCTIONS  Only take over-the-counter or prescription medications as directed by your health care provider.  Do not drink alcohol, especially if you are breastfeeding or taking medication to relieve pain.  Do not chew or smoke tobacco.  Continue to use good perineal care. Good perineal care includes:  Wiping your perineum from front to back.  Keeping your perineum clean.  Check your surgical cut (incision) daily for increased redness, drainage, swelling, or separation of skin.  Clean your incision gently with soap and water every day, and then pat it dry. If your health care provider says it is okay, leave the incision uncovered. Use a bandage (dressing) if the incision is draining fluid or appears irritated. If the adhesive strips across the incision do not fall off within 7 days, carefully peel them off.  Hug a pillow when coughing or sneezing until your incision is healed. This helps to relieve pain.  Do not use tampons or douche for the next 6 weeks.   Showers only, no tub baths for the next 6 weeks.   Wear a well-fitting bra that provides breast support.  Limit wearing support panties or control-top hose.  Drink enough fluids to keep your urine clear or pale yellow.  Eat high-fiber foods such as whole grain cereals and breads, brown rice, beans, and fresh fruits and vegetables every day. These foods may help prevent or relieve constipation.  Resume activities such as climbing stairs, driving, lifting, exercising, or traveling as directed by your  health care provider.  No sexual intercourse for the next 6 weeks.   Try to have someone help you with your household activities and your newborn for at least a few days after you leave the hospital.  Rest as much as possible. Try to rest or take a nap when your newborn is sleeping.  Increase your activities gradually.  Keep all of your scheduled postpartum appointments. It is very important to keep your scheduled follow-up appointments. At these appointments, your health care provider will be checking to make sure that you are healing physically and emotionally. SEEK MEDICAL CARE IF:   You are passing large clots from your vagina. Save any clots to show your health care provider.  You have a foul smelling discharge from your vagina.  You have trouble urinating.  You are urinating frequently.  You have pain when you urinate.  You have a change in your bowel movements.  You have increasing redness, pain, or swelling near your incision.  You have pus draining from your incision.  Your incision is separating.  You have painful, hard, or reddened breasts.  You have a severe headache.  You have blurred vision or see spots.  You feel sad or depressed.  You have thoughts of hurting yourself or your newborn.  You have questions about your care, the care of your newborn, or medications.  You are dizzy or light-headed.  You have a rash.  You have pain, redness, or swelling at the site of the removed intravenous access (IV) tube.  You have nausea or vomiting.  You stopped breastfeeding and have not had  a menstrual period within 12 weeks of stopping.  You are not breastfeeding and have not had a menstrual period within 12 weeks of delivery.  You have a fever. SEEK IMMEDIATE MEDICAL CARE IF:  You have persistent pain.  You have chest pain.  You have shortness of breath.  You faint.  You have leg pain.  You have stomach pain.  Your vaginal bleeding saturates 2  or more sanitary pads in 1 hour. MAKE SURE YOU:   Understand these instructions.  Will watch your condition.  Will get help right away if you are not doing well or get worse.   This information is not intended to replace advice given to you by your health care provider. Make sure you discuss any questions you have with your health care provider.   Document Released: 03/22/2002 Document Revised: 07/21/2014 Document Reviewed: 02/25/2012 Elsevier Interactive Patient Education [employer]2016 [employer]vier Inc.

## 2015-06-15 NOTE — Progress Notes (Signed)
Patient discharged home with infant. Vital signs stable, bleeding within normal limits, uterus firm. Discharge instructions, prescriptions, and follow up appointment given to and reviewed with patient. Patient verbalized understanding, all questions answered. Escorted in wheelchair by nursing.    

## 2017-12-08 DIAGNOSIS — F5105 Insomnia due to other mental disorder: Secondary | ICD-10-CM

## 2017-12-08 DIAGNOSIS — F329 Major depressive disorder, single episode, unspecified: Secondary | ICD-10-CM

## 2017-12-08 DIAGNOSIS — F99 Mental disorder, not otherwise specified: Secondary | ICD-10-CM

## 2017-12-08 DIAGNOSIS — I7300 Raynaud's syndrome without gangrene: Secondary | ICD-10-CM

## 2017-12-10 ENCOUNTER — Telehealth: Payer: Self-pay | Admitting: Nurse Practitioner

## 2017-12-10 NOTE — Telephone Encounter (Signed)
Left message on pt voicemail explaining she would need to make an appt in order for her to get the prescription she was requesting per Vincent Gros.

## 2017-12-14 ENCOUNTER — Ambulatory Visit: Payer: Self-pay | Admitting: Nurse Practitioner

## 2017-12-14 ENCOUNTER — Encounter: Payer: Self-pay | Admitting: Nurse Practitioner

## 2017-12-14 VITALS — BP 145/73 | HR 109 | Resp 16 | Ht 59.0 in | Wt 181.6 lb

## 2017-12-14 DIAGNOSIS — F5105 Insomnia due to other mental disorder: Secondary | ICD-10-CM

## 2017-12-14 DIAGNOSIS — E668 Other obesity: Secondary | ICD-10-CM | POA: Diagnosis not present

## 2017-12-14 DIAGNOSIS — L700 Acne vulgaris: Secondary | ICD-10-CM

## 2017-12-14 DIAGNOSIS — F329 Major depressive disorder, single episode, unspecified: Secondary | ICD-10-CM

## 2017-12-14 DIAGNOSIS — E669 Obesity, unspecified: Secondary | ICD-10-CM

## 2017-12-14 DIAGNOSIS — F99 Mental disorder, not otherwise specified: Secondary | ICD-10-CM

## 2017-12-14 MED ORDER — FLUOXETINE HCL 40 MG PO CAPS: 80.0000 mg | ORAL_CAPSULE | Freq: Every day | ORAL | 4 refills | Status: DC

## 2017-12-14 MED ORDER — DROSPIRENONE-ETHINYL ESTRADIOL 3-0.02 MG PO TABS: 1.0000 | ORAL_TABLET | Freq: Every day | ORAL | 4 refills | Status: DC

## 2017-12-14 NOTE — Progress Notes (Signed)
[facility] [address] [place], [place] [address]  Internal MEDICINE  Office Visit Note  Patient Name: Rebecca Ponce  [id number]  [id number]  Date of Service: 12/14/2017   Pt is here for routine follow up.   Chief Complaint  Patient presents with  . Acne  . Depression    The patient has had increased issues with her depression and social anxiety. States that she is gating ready to go back to work and know that social anxiety is going to get worse. Currently on fluoxetine 60mg , however, has not taken in 2 months.  She is also concerned about worsening facial acne. Would like to try hormonal therapy to help with her acne. She has had tubal ligation so does not need hormones for contraceptive use.       Current Medication: Outpatient Encounter Medications as of 12/14/2017  Medication Sig  . zolpidem (AMBIEN CR) 12.5 MG CR tablet Take 12.5 mg by mouth at bedtime as needed for sleep.  . [DISCONTINUED] FLUoxetine (PROZAC) 20 MG tablet Take 20 mg by mouth daily. Take 3 tab po daily.  . drospirenone-ethinyl estradiol (YAZ) 3-0.02 MG tablet Take 1 tablet by mouth daily.  Marland Kitchen FLUoxetine (PROZAC) 40 MG capsule Take 2 capsules (80 mg total) by mouth daily.   No facility-administered encounter medications on file as of 12/14/2017.     Surgical History: Past Surgical History:  Procedure Laterality Date  . CESAREAN SECTION    . CHOLECYSTECTOMY    . TONSILLECTOMY      Medical History: Past Medical History:  Diagnosis Date  . Anemia   . Depression   . Hyperlipidemia   . Insomnia     Family History: Family History  Problem Relation Age of Onset  . Anxiety disorder Mother   . Diabetes Mother   . Hypertension Father   . Diabetes Maternal Aunt     Social History   Socioeconomic History  . Marital status: Married    Spouse name: Not on file  . Number of children: Not on file  . Years of education: Not on file  . Highest education level: Not on file  Occupational  History  . Not on file  Social Needs  . Financial resource strain: Not on file  . Food insecurity:    Worry: Not on file    Inability: Not on file  . Transportation needs:    Medical: Not on file    Non-medical: Not on file  Tobacco Use  . Smoking status: Never Smoker  . Smokeless tobacco: Never Used  Substance and Sexual Activity  . Alcohol use: Yes    Comment: socially   . Drug use: Never  . Sexual activity: Not on file  Lifestyle  . Physical activity:    Days per week: Not on file    Minutes per session: Not on file  . Stress: Not on file  Relationships  . Social connections:    Talks on phone: Not on file    Gets together: Not on file    Attends religious service: Not on file    Active member of club or organization: Not on file    Attends meetings of clubs or organizations: Not on file    Relationship status: Not on file  . Intimate partner violence:    Fear of current or ex partner: Not on file    Emotionally abused: Not on file    Physically abused: Not on file    Forced sexual activity:  Not on file  Other Topics Concern  . Not on file  Social History Narrative  . Not on file      Review of Systems  Constitutional: Positive for fatigue. Negative for chills and unexpected weight change.  HENT: Negative for congestion, postnasal drip, rhinorrhea, sneezing and sore throat.   Eyes: Negative.  Negative for redness.  Respiratory: Negative for cough, chest tightness and shortness of breath.   Cardiovascular: Negative for chest pain and palpitations.  Gastrointestinal: Negative for abdominal pain, constipation, diarrhea, nausea and vomiting.  Endocrine: Negative for cold intolerance, polydipsia, polyphagia and polyuria.  Genitourinary: Negative for dysuria, frequency, vaginal bleeding and vaginal pain.  Musculoskeletal: Negative for arthralgias, back pain, joint swelling and neck pain.  Skin:       Facial acne getting worse over last few months.    Allergic/Immunologic: Negative for environmental allergies.  Neurological: Positive for headaches. Negative for tremors and numbness.  Hematological: Negative for adenopathy. Does not bruise/bleed easily.  Psychiatric/Behavioral: Positive for decreased concentration, dysphoric mood and sleep disturbance. Negative for behavioral problems (Depression) and suicidal ideas. The patient is nervous/anxious.     Today's Vitals   12/14/17 1043  BP: (!) 145/73  Pulse: (!) 109  Resp: 16  SpO2: 99%  Weight: 181 lb 9.6 oz (82.4 kg)  Height: 4\' 11"  (1.499 m)    Physical Exam  Constitutional: She is oriented to person, place, and time. She appears well-developed and well-nourished. No distress.  HENT:  Head: Normocephalic and atraumatic.  Nose: Nose normal.  Mouth/Throat: Oropharynx is clear and moist. No oropharyngeal exudate.  Eyes: Pupils are equal, round, and reactive to light. Conjunctivae and EOM are normal.  Neck: Normal range of motion. Neck supple. No JVD present. No tracheal deviation present. No thyromegaly present.  Cardiovascular: Normal rate, regular rhythm and normal heart sounds. Exam reveals no gallop and no friction rub.  No murmur heard. Pulmonary/Chest: Effort normal and breath sounds normal. No respiratory distress. She has no wheezes. She has no rales. She exhibits no tenderness.  Abdominal: Soft. Bowel sounds are normal. There is no tenderness.  Musculoskeletal: Normal range of motion.  Lymphadenopathy:    She has no cervical adenopathy.  Neurological: She is alert and oriented to person, place, and time. No cranial nerve deficit.  Skin: Skin is warm and dry. She is not diaphoretic.  Mild to moderate facial acne present.  Psychiatric: Her speech is normal and behavior is normal. Judgment and thought content normal. Cognition and memory are normal. She exhibits a depressed mood.  Nursing note and vitals reviewed.  Assessment/Plan:  1. Acne vulgaris Start yaz to help  control acne.  - drospirenone-ethinyl estradiol (YAZ) 3-0.02 MG tablet; Take 1 tablet by mouth daily.  Dispense: 3 Package; Refill: 4  2. Major depressive disorder with single episode, remission status unspecified Increase prozac to 80mg  daily. Start back with 40mg  daily for a few days, then increase to twice daily.  - FLUoxetine (PROZAC) 40 MG capsule; Take 2 capsules (80 mg total) by mouth daily.  Dispense: 180 capsule; Refill: 4  3. Insomnia due to other mental disorder May continue [place] as needed and as prescribed.   4. Moderate obesity Discussed diet and lifestyle changes to help with weight loss. Will consider appetite suppressant at next visit if BP stable and doing well on current medications.   General Counseling: Rosangela verbalizes understanding of the findings of todays visit and agrees with plan of treatment. I have discussed any further diagnostic evaluation that  may be needed or ordered today. We also reviewed her medications today. she has been encouraged to call the office with any questions or concerns that should arise related to todays visit.    Counseling:   There is a liability release in patients' chart. There has been a 10 minute discussion about the side effects including but not limited to elevated blood pressure, anxiety, lack of sleep and dry mouth. Pt understands and will like to start/continue on appetite suppressant at this time. There will be one month RX given at the time of visit with proper follow up. [place] diet plan with restricted calories is given to the pt. Pt understands and agrees with  plan of treatment  This patient was seen by Vincent GrosHeather Arrie Zuercher, FNP- C in Collaboration with Dr Lyndon CodeFozia M Khan as a part of collaborative care agreement  Meds ordered this encounter  Medications  . FLUoxetine (PROZAC) 40 MG capsule    Sig: Take 2 capsules (80 mg total) by mouth daily.    Dispense:  180 capsule    Refill:  4    Order Specific Question:   Supervising  Provider    Answer:   Lyndon CodeKHAN, FOZIA M [[id number]]  . drospirenone-ethinyl estradiol (YAZ) 3-0.02 MG tablet    Sig: Take 1 tablet by mouth daily.    Dispense:  3 Package    Refill:  4    Order Specific Question:   Supervising Provider    Answer:   Lyndon CodeKHAN, FOZIA M [[id number]]    Time spent: 20Minutes          Dr Lyndon CodeFozia M Khan Internal medicine

## 2017-12-29 ENCOUNTER — Ambulatory Visit: Payer: Self-pay | Admitting: Nurse Practitioner

## 2018-01-15 ENCOUNTER — Encounter: Payer: Self-pay | Admitting: Nurse Practitioner

## 2018-01-15 ENCOUNTER — Ambulatory Visit: Payer: Commercial Managed Care - PPO | Admitting: Nurse Practitioner

## 2018-01-15 ENCOUNTER — Encounter (INDEPENDENT_AMBULATORY_CARE_PROVIDER_SITE_OTHER): Payer: Self-pay

## 2018-01-15 VITALS — BP 119/71 | HR 105 | Temp 99.7°F | Resp 16 | Ht 59.0 in | Wt 171.0 lb

## 2018-01-15 DIAGNOSIS — F329 Major depressive disorder, single episode, unspecified: Secondary | ICD-10-CM | POA: Diagnosis not present

## 2018-01-15 DIAGNOSIS — K529 Noninfective gastroenteritis and colitis, unspecified: Secondary | ICD-10-CM | POA: Diagnosis not present

## 2018-01-15 DIAGNOSIS — E668 Other obesity: Secondary | ICD-10-CM

## 2018-01-15 DIAGNOSIS — E669 Obesity, unspecified: Secondary | ICD-10-CM

## 2018-01-15 DIAGNOSIS — R1013 Epigastric pain: Secondary | ICD-10-CM

## 2018-01-15 MED ORDER — METRONIDAZOLE 500 MG PO TABS: 500.0000 mg | ORAL_TABLET | Freq: Two times a day (BID) | ORAL | 0 refills | Status: DC

## 2018-01-15 MED ORDER — CIPROFLOXACIN HCL 500 MG PO TABS: 500.0000 mg | ORAL_TABLET | Freq: Two times a day (BID) | ORAL | 0 refills | Status: DC

## 2018-01-15 MED ORDER — LANSOPRAZOLE 30 MG PO CPDR: 30.0000 mg | DELAYED_RELEASE_CAPSULE | Freq: Every day | ORAL | 1 refills | Status: DC

## 2018-01-15 MED ORDER — PHENTERMINE HCL 37.5 MG PO TABS: 37.5000 mg | ORAL_TABLET | Freq: Every day | ORAL | 1 refills | Status: DC

## 2018-01-15 NOTE — Progress Notes (Signed)
[facility] [address] [place], [place] [id number]  Internal MEDICINE  Office Visit Note  Patient Name: Rebecca BentonKristy Ponce  [id number] 11, 1992  [id number]  Date of Service: 01/15/2018   Pt is here for a sick visit.   Chief Complaint  Patient presents with  . Abdominal Pain  . Nausea  . Diarrhea     The patient states that she has had upper abdominal pain with sour tasting burps. She has also had multiple episodes of diarrhea. Has had 5 to 6 episodes of diarrhea every day since Sunday. She has taken pepto bismol to help her stomach, but this has really not helped at all. Denies nausea or vomiting. Denies fever or abdominal cramping.        Current Medication:  Outpatient Encounter Medications as of 01/15/2018  Medication Sig  . ciprofloxacin (CIPRO) 500 MG tablet Take 1 tablet (500 mg total) by mouth 2 (two) times daily.  . drospirenone-ethinyl estradiol (YAZ) 3-0.02 MG tablet Take 1 tablet by mouth daily.  Marland Kitchen. FLUoxetine (PROZAC) 40 MG capsule Take 2 capsules (80 mg total) by mouth daily.  Marland Kitchen. ibuprofen (ADVIL,MOTRIN) 600 MG tablet Take 1 tablet (600 mg total) by mouth every 6 (six) hours as needed for fever or headache.  . lansoprazole (PREVACID) 30 MG capsule Take 1 capsule (30 mg total) by mouth daily at 12 noon.  . metroNIDAZOLE (FLAGYL) 500 MG tablet Take 1 tablet (500 mg total) by mouth 2 (two) times daily.  Marland Kitchen. oxyCODONE-acetaminophen (PERCOCET/ROXICET) 5-325 MG tablet Take 1-2 tablets by mouth every 6 (six) hours as needed for moderate pain or severe pain.  . phentermine (ADIPEX-P) 37.5 MG tablet Take 1 tablet (37.5 mg total) by mouth daily before breakfast.  . Prenatal Vit-DSS-Fe Cbn-FA (PRENATAL MULTIVITAMIN-ULTRA PO) Take 1 tablet by mouth daily.  Marland Kitchen. zolpidem (AMBIEN CR) 12.5 MG CR tablet Take 12.5 mg by mouth at bedtime as needed for sleep.   No facility-administered encounter medications on file as of 01/15/2018.       Medical History: Past Medical History:   Diagnosis Date  . Anemia   . Depression   . Gestational diabetes   . Hyperlipidemia   . Insomnia      Vital Signs: BP 119/71   Pulse (!) 105   Temp 99.7 F (37.6 C)   Resp 16   Ht 4\' 11"  (1.499 m)   Wt 171 lb (77.6 kg)   SpO2 98%   BMI 34.54 kg/m    Review of Systems  Constitutional: Positive for fatigue. Negative for chills and unexpected weight change.       Patient has had nearly 10 pound weight loss since her last visit .  HENT: Negative for congestion, postnasal drip, rhinorrhea, sneezing and sore throat.   Eyes: Negative.  Negative for redness.  Respiratory: Negative for cough, chest tightness, shortness of breath and wheezing.   Cardiovascular: Negative for chest pain and palpitations.  Gastrointestinal: Positive for diarrhea. Negative for abdominal pain, constipation, nausea and vomiting.  Endocrine: Negative for cold intolerance, heat intolerance, polydipsia, polyphagia and polyuria.  Genitourinary: Negative for dysuria, frequency, hematuria and menstrual problem.  Musculoskeletal: Negative for arthralgias, back pain, joint swelling and neck pain.  Skin: Negative for rash.  Allergic/Immunologic: Negative for environmental allergies.  Neurological: Positive for dizziness and headaches. Negative for tremors and numbness.  Hematological: Negative for adenopathy. Does not bruise/bleed easily.  Psychiatric/Behavioral: Positive for dysphoric mood and sleep disturbance. Negative for behavioral problems (Depression) and suicidal ideas. The patient is  nervous/anxious.        Doing better with increased dose of prozac.    Physical Exam  Constitutional: She is oriented to person, place, and time. She appears well-developed and well-nourished. No distress.  HENT:  Head: Normocephalic and atraumatic.  Nose: Nose normal.  Mouth/Throat: Oropharynx is clear and moist. No oropharyngeal exudate.  Eyes: Pupils are equal, round, and reactive to light. Conjunctivae and EOM are  normal.  Neck: Normal range of motion. Neck supple. No JVD present. No tracheal deviation present. No thyromegaly present.  Cardiovascular: Normal rate, regular rhythm and normal heart sounds. Exam reveals no gallop and no friction rub.  No murmur heard. Pulmonary/Chest: Effort normal and breath sounds normal. No respiratory distress. She has no wheezes. She has no rales. She exhibits no tenderness.  Abdominal: Soft. Normal appearance and bowel sounds are normal. There is no hepatomegaly. There is tenderness in the right upper quadrant and periumbilical area. There is no rigidity and no guarding.  Musculoskeletal: Normal range of motion.  Lymphadenopathy:    She has no cervical adenopathy.  Neurological: She is alert and oriented to person, place, and time. No cranial nerve deficit.  Skin: Skin is warm and dry. Capillary refill takes less than 2 seconds. She is not diaphoretic.  Mild to moderate facial acne present.  Psychiatric: Her speech is normal and behavior is normal. Judgment and thought content normal. Cognition and memory are normal. She exhibits a depressed mood.  Nursing note and vitals reviewed.  Assessment/Plan: 1. Gastroenteritis Presumed of infectious nature. Start cipro 500mg  and flagyl500mg , both twice daily for 10 days. Advised her to use OTC imodium as needed and as indicated for diarrhea. Recommend BRAT diet. Advance as tolerated. Will get stool studies and abdominal imaging if no improvement over next several days.  - ciprofloxacin (CIPRO) 500 MG tablet; Take 1 tablet (500 mg total) by mouth 2 (two) times daily.  Dispense: 20 tablet; Refill: 0 - metroNIDAZOLE (FLAGYL) 500 MG tablet; Take 1 tablet (500 mg total) by mouth 2 (two) times daily.  Dispense: 20 tablet; Refill: 0  2. Epigastric abdominal pain Add prevacid 30mg  daily. - lansoprazole (PREVACID) 30 MG capsule; Take 1 capsule (30 mg total) by mouth daily at 12 noon.  Dispense: 30 capsule; Refill: 1  3. Major  depressive disorder with single episode, remission status unspecified contiue fluoxetine at increased dose .  4. Moderate obesity May conitnue phentermine 37.5mg  daily after resolution of GI infection. Limit calorie intake to 1200-1500 calories per day. - phentermine (ADIPEX-P) 37.5 MG tablet; Take 1 tablet (37.5 mg total) by mouth daily before breakfast.  Dispense: 30 tablet; Refill: 1  General Counseling: Deasha verbalizes understanding of the findings of todays visit and agrees with plan of treatment. I have discussed any further diagnostic evaluation that may be needed or ordered today. We also reviewed her medications today. she has been encouraged to call the office with any questions or concerns that should arise related to todays visit.    Counseling:  Rest and increase fluids. Continue using OTC medication to control symptoms.   The 'BRAT' diet is suggested, then progress to diet as tolerated as symptoms abate. Call if bloody stools, persistent diarrhea, vomiting, fever or abdominal pain.  Meds ordered this encounter  Medications  . ciprofloxacin (CIPRO) 500 MG tablet    Sig: Take 1 tablet (500 mg total) by mouth 2 (two) times daily.    Dispense:  20 tablet    Refill:  0  Order Specific Question:   Supervising Provider    Answer:   Lyndon Code [[id number]]  . metroNIDAZOLE (FLAGYL) 500 MG tablet    Sig: Take 1 tablet (500 mg total) by mouth 2 (two) times daily.    Dispense:  20 tablet    Refill:  0    Order Specific Question:   Supervising Provider    Answer:   Lyndon Code [[id number]]  . lansoprazole (PREVACID) 30 MG capsule    Sig: Take 1 capsule (30 mg total) by mouth daily at 12 noon.    Dispense:  30 capsule    Refill:  1    Order Specific Question:   Supervising Provider    Answer:   Lyndon Code [[id number]]  . phentermine (ADIPEX-P) 37.5 MG tablet    Sig: Take 1 tablet (37.5 mg total) by mouth daily before breakfast.    Dispense:  30 tablet    Refill:  1    Order  Specific Question:   Supervising Provider    Answer:   Lyndon Code [[id number]]    Time spent: 15 Minutes

## 2018-01-25 ENCOUNTER — Ambulatory Visit: Payer: Self-pay | Admitting: Nurse Practitioner

## 2018-02-26 ENCOUNTER — Ambulatory Visit: Payer: Commercial Managed Care - PPO | Admitting: Nurse Practitioner

## 2018-02-26 ENCOUNTER — Encounter: Payer: Self-pay | Admitting: Nurse Practitioner

## 2018-02-26 VITALS — BP 121/80 | HR 92 | Resp 16 | Ht 59.0 in | Wt 171.8 lb

## 2018-02-26 DIAGNOSIS — F329 Major depressive disorder, single episode, unspecified: Secondary | ICD-10-CM | POA: Diagnosis not present

## 2018-02-26 DIAGNOSIS — E349 Endocrine disorder, unspecified: Secondary | ICD-10-CM | POA: Diagnosis not present

## 2018-02-26 DIAGNOSIS — E668 Other obesity: Secondary | ICD-10-CM | POA: Diagnosis not present

## 2018-02-26 MED ORDER — PHENTERMINE HCL 37.5 MG PO TABS: 37.5000 mg | ORAL_TABLET | Freq: Every day | ORAL | 1 refills | Status: DC

## 2018-02-26 MED ORDER — FLUOXETINE HCL 60 MG PO TABS: 1.0000 | ORAL_TABLET | Freq: Every day | ORAL | 3 refills | Status: DC

## 2018-02-26 NOTE — Progress Notes (Signed)
[facility] [address] [place], [place] [address]  Internal MEDICINE  Office Visit Note  Patient Name: Rebecca Ponce  [id number]  [id number]  Date of Service: 03/10/2018  Chief Complaint  Patient presents with  . Fatigue    The patient has had increased issues with her depression and social anxiety. Has started working a night shift position. Is doing well. Feels like the increase in her fluoxetine dose made it difficult for her to sleep while off. She states that she is still not taking it, but feels like she did better on 60mg  dosing.  She still struggles with weight control.       Current Medication: Outpatient Encounter Medications as of 02/26/2018  Medication Sig  . drospirenone-ethinyl estradiol (YAZ) 3-0.02 MG tablet Take 1 tablet by mouth daily.  Marland Kitchen ibuprofen (ADVIL,MOTRIN) 600 MG tablet Take 1 tablet (600 mg total) by mouth every 6 (six) hours as needed for fever or headache.  . lansoprazole (PREVACID) 30 MG capsule Take 1 capsule (30 mg total) by mouth daily at 12 noon.  Marland Kitchen oxyCODONE-acetaminophen (PERCOCET/ROXICET) 5-325 MG tablet Take 1-2 tablets by mouth every 6 (six) hours as needed for moderate pain or severe pain.  . phentermine (ADIPEX-P) 37.5 MG tablet Take 1 tablet (37.5 mg total) by mouth daily before breakfast.  . Prenatal Vit-DSS-Fe Cbn-FA (PRENATAL MULTIVITAMIN-ULTRA PO) Take 1 tablet by mouth daily.  Marland Kitchen zolpidem (AMBIEN CR) 12.5 MG CR tablet Take 12.5 mg by mouth at bedtime as needed for sleep.  . [DISCONTINUED] FLUoxetine (PROZAC) 40 MG capsule Take 2 capsules (80 mg total) by mouth daily.  . [DISCONTINUED] phentermine (ADIPEX-P) 37.5 MG tablet Take 1 tablet (37.5 mg total) by mouth daily before breakfast.  . ciprofloxacin (CIPRO) 500 MG tablet Take 1 tablet (500 mg total) by mouth 2 (two) times daily. (Patient not taking: Reported on 02/26/2018)  . FLUoxetine HCl 60 MG TABS Take 1 tablet by mouth daily.  . metroNIDAZOLE (FLAGYL) 500 MG tablet  Take 1 tablet (500 mg total) by mouth 2 (two) times daily. (Patient not taking: Reported on 02/26/2018)   No facility-administered encounter medications on file as of 02/26/2018.     Surgical History: Past Surgical History:  Procedure Laterality Date  . CESAREAN SECTION     twice  . CESAREAN SECTION    . CESAREAN SECTION WITH BILATERAL TUBAL LIGATION N/A 06/11/2015   Procedure: CESAREAN SECTION WITH BILATERAL TUBAL LIGATION;  Surgeon: Vena Austria, MD;  Location: [facility] ORS;  Service: Obstetrics;  Laterality: N/A;  . CHOLECYSTECTOMY    . TONSILLECTOMY    . TONSILLECTOMY      Medical History: Past Medical History:  Diagnosis Date  . Anemia   . Depression   . Gestational diabetes   . Hyperlipidemia   . Insomnia     Family History: Family History  Problem Relation Age of Onset  . Anxiety disorder Mother   . Diabetes Mother   . Hypertension Father   . Diabetes Maternal Aunt     Social History   Socioeconomic History  . Marital status: Married    Spouse name: Not on file  . Number of children: Not on file  . Years of education: Not on file  . Highest education level: Not on file  Occupational History  . Not on file  Social Needs  . Financial resource strain: Not on file  . Food insecurity:    Worry: Not on file    Inability: Not on file  . Transportation  needs:    Medical: Not on file    Non-medical: Not on file  Tobacco Use  . Smoking status: Never Smoker  . Smokeless tobacco: Never Used  . Tobacco comment: years ago  Substance and Sexual Activity  . Alcohol use: Yes    Comment: socially   . Drug use: Never  . Sexual activity: Not on file  Lifestyle  . Physical activity:    Days per week: Not on file    Minutes per session: Not on file  . Stress: Not on file  Relationships  . Social connections:    Talks on phone: Not on file    Gets together: Not on file    Attends religious service: Not on file    Active member of club or organization: Not on  file    Attends meetings of clubs or organizations: Not on file    Relationship status: Not on file  . Intimate partner violence:    Fear of current or ex partner: Not on file    Emotionally abused: Not on file    Physically abused: Not on file    Forced sexual activity: Not on file  Other Topics Concern  . Not on file  Social History Narrative   ** Merged History Encounter **          Review of Systems  Constitutional: Positive for fatigue. Negative for chills and unexpected weight change.       Weight maintenance since her last visit.   HENT: Negative for congestion, postnasal drip, rhinorrhea, sneezing and sore throat.   Eyes: Negative.  Negative for redness.  Respiratory: Negative for cough, chest tightness, shortness of breath and wheezing.   Cardiovascular: Negative for chest pain and palpitations.  Gastrointestinal: Negative for abdominal pain, constipation, diarrhea, nausea and vomiting.  Endocrine: Negative for cold intolerance, heat intolerance, polydipsia, polyphagia and polyuria.  Genitourinary: Negative for dysuria, frequency, hematuria and menstrual problem.  Musculoskeletal: Negative for arthralgias, back pain, joint swelling and neck pain.  Skin: Negative for rash.       Continues to have some facial acne.   Allergic/Immunologic: Negative for environmental allergies.  Neurological: Positive for dizziness and headaches. Negative for tremors and numbness.  Hematological: Negative for adenopathy. Does not bruise/bleed easily.  Psychiatric/Behavioral: Positive for dysphoric mood and sleep disturbance. Negative for behavioral problems (Depression) and suicidal ideas. The patient is nervous/anxious.        Not really taking her prozc. Increased dose made it difficult for her to sleep.    Today's Vitals   02/26/18 1056  BP: 121/80  Pulse: 92  Resp: 16  SpO2: 97%  Weight: 171 lb 12.8 oz (77.9 kg)  Height: 4\' 11"  (1.499 m)    Physical Exam  Constitutional: She  is oriented to person, place, and time. She appears well-developed and well-nourished. No distress.  HENT:  Head: Normocephalic and atraumatic.  Nose: Nose normal.  Mouth/Throat: Oropharynx is clear and moist. No oropharyngeal exudate.  Eyes: Pupils are equal, round, and reactive to light. Conjunctivae and EOM are normal.  Neck: Normal range of motion. Neck supple. No JVD present. No tracheal deviation present. No thyromegaly present.  Cardiovascular: Normal rate, regular rhythm and normal heart sounds. Exam reveals no gallop and no friction rub.  No murmur heard. Pulmonary/Chest: Effort normal and breath sounds normal. No respiratory distress. She has no wheezes. She has no rales. She exhibits no tenderness.  Abdominal: Soft. Normal appearance and bowel sounds are normal. There is no  hepatomegaly. There is no tenderness. There is no rigidity and no guarding.  Musculoskeletal: Normal range of motion.  Lymphadenopathy:    She has no cervical adenopathy.  Neurological: She is alert and oriented to person, place, and time. No cranial nerve deficit.  Skin: Skin is warm and dry. Capillary refill takes less than 2 seconds. She is not diaphoretic.  Mild to moderate facial acne present.  Psychiatric: Her speech is normal and behavior is normal. Judgment and thought content normal. Cognition and memory are normal. She exhibits a depressed mood.  Nursing note and vitals reviewed.  Assessment/Plan: 1. Hormone imbalance Will check labs including reproductive and thyroid hormone panels.   2. Major depressive disorder with single episode, remission status unspecified Go back to 60mg  dose of fluoxetine daily.  - FLUoxetine HCl 60 MG TABS; Take 1 tablet by mouth daily.  Dispense: 30 tablet; Refill: 3  3. Moderate obesity Restart phentermine. Follow low calorie, low fat diet. Increase routine, cardiovascular exercise.  - phentermine (ADIPEX-P) 37.5 MG tablet; Take 1 tablet (37.5 mg total) by mouth daily  before breakfast.  Dispense: 30 tablet; Refill: 1  General Counseling: Lafern verbalizes understanding of the findings of todays visit and agrees with plan of treatment. I have discussed any further diagnostic evaluation that may be needed or ordered today. We also reviewed her medications today. she has been encouraged to call the office with any questions or concerns that should arise related to todays visit.   This patient was seen by Vincent GrosHeather Kelia Gibbon FNP Collaboration with Dr Lyndon CodeFozia M Khan as a part of collaborative care agreement   Meds ordered this encounter  Medications  . FLUoxetine HCl 60 MG TABS    Sig: Take 1 tablet by mouth daily.    Dispense:  30 tablet    Refill:  3    Please note change in dose    Order Specific Question:   Supervising Provider    Answer:   Lyndon CodeKHAN, FOZIA M [[id number]]  . phentermine (ADIPEX-P) 37.5 MG tablet    Sig: Take 1 tablet (37.5 mg total) by mouth daily before breakfast.    Dispense:  30 tablet    Refill:  1    Order Specific Question:   Supervising Provider    Answer:   Lyndon CodeKHAN, FOZIA M [[id number]]    Time spent: 8015 Minutes      Dr Lyndon CodeFozia M Khan Internal medicine

## 2018-03-10 DIAGNOSIS — E119 Type 2 diabetes mellitus without complications: Secondary | ICD-10-CM | POA: Insufficient documentation

## 2018-03-10 DIAGNOSIS — E349 Endocrine disorder, unspecified: Secondary | ICD-10-CM | POA: Insufficient documentation

## 2018-03-17 ENCOUNTER — Encounter: Payer: Self-pay | Admitting: Adult Health

## 2018-03-17 ENCOUNTER — Ambulatory Visit: Payer: Commercial Managed Care - PPO | Admitting: Adult Health

## 2018-03-17 VITALS — BP 150/90 | HR 108 | Temp 98.2°F | Resp 16 | Ht 59.0 in | Wt 163.8 lb

## 2018-03-17 DIAGNOSIS — R300 Dysuria: Secondary | ICD-10-CM | POA: Diagnosis not present

## 2018-03-17 DIAGNOSIS — R1013 Epigastric pain: Secondary | ICD-10-CM

## 2018-03-17 DIAGNOSIS — R030 Elevated blood-pressure reading, without diagnosis of hypertension: Secondary | ICD-10-CM

## 2018-03-17 DIAGNOSIS — N390 Urinary tract infection, site not specified: Secondary | ICD-10-CM | POA: Diagnosis not present

## 2018-03-17 DIAGNOSIS — E668 Other obesity: Secondary | ICD-10-CM | POA: Diagnosis not present

## 2018-03-17 DIAGNOSIS — R319 Hematuria, unspecified: Secondary | ICD-10-CM

## 2018-03-17 LAB — POCT URINALYSIS DIPSTICK
BILIRUBIN UA: NEGATIVE
Glucose, UA: NEGATIVE
KETONES UA: 80
Leukocytes, UA: NEGATIVE
NITRITE UA: NEGATIVE
PH UA: 6.5 (ref 5.0–8.0)
PROTEIN UA: POSITIVE — AB
SPEC GRAV UA: 1.02 (ref 1.010–1.025)
Urobilinogen, UA: 0.2 E.U./dL

## 2018-03-17 MED ORDER — OMEPRAZOLE 20 MG PO CPDR
20.0000 mg | DELAYED_RELEASE_CAPSULE | Freq: Every day | ORAL | 3 refills | Status: DC
Start: 2018-03-17 — End: 2019-02-03

## 2018-03-17 MED ORDER — SULFAMETHOXAZOLE-TRIMETHOPRIM 400-80 MG PO TABS: 1.0000 | ORAL_TABLET | Freq: Two times a day (BID) | ORAL | 0 refills | Status: AC

## 2018-03-17 NOTE — Progress Notes (Signed)
[facility] [address] [place], [place] [address]  Internal MEDICINE  Office Visit Note  Patient Name: Rebecca Ponce  [id number]  [id number]  Date of Service: 03/29/2018  Chief Complaint  Patient presents with  . Abdominal Pain    pain above the belly button, taking in deep breathes and yawning and laying on sides hurt, pain with pressure   . Nausea  . Headache   HPI Pt is here for a sick visit. She reports epigastric pain x 4 days.  She denies injuring herself.  She describes the pain as sharp stabbing pain 7/10. The pain gets worse when she takes a deep breath. She denies recent long distance travel by car or airplane. She denies bladder or bowel changes. She is sitting up on exam table with no distress.  She reports she was seen for gastroenteritis a few weeks ago.  She completed her antibiotics and other medications.    Current Medication:  Outpatient Encounter Medications as of 03/17/2018  Medication Sig  . drospirenone-ethinyl estradiol (YAZ) 3-0.02 MG tablet Take 1 tablet by mouth daily.  Marland Kitchen FLUoxetine HCl 60 MG TABS Take 1 tablet by mouth daily.  Marland Kitchen ibuprofen (ADVIL,MOTRIN) 600 MG tablet Take 1 tablet (600 mg total) by mouth every 6 (six) hours as needed for fever or headache.  . lansoprazole (PREVACID) 30 MG capsule Take 1 capsule (30 mg total) by mouth daily at 12 noon.  . phentermine (ADIPEX-P) 37.5 MG tablet Take 1 tablet (37.5 mg total) by mouth daily before breakfast.  . omeprazole (PRILOSEC) 20 MG capsule Take 1 capsule (20 mg total) by mouth daily.  Marland Kitchen oxyCODONE-acetaminophen (PERCOCET/ROXICET) 5-325 MG tablet Take 1-2 tablets by mouth every 6 (six) hours as needed for moderate pain or severe pain. (Patient not taking: Reported on 03/17/2018)  . [EXPIRED] sulfamethoxazole-trimethoprim (BACTRIM,SEPTRA) 400-80 MG tablet Take 1 tablet by mouth 2 (two) times daily for 10 days.  . [DISCONTINUED] ciprofloxacin (CIPRO) 500 MG tablet Take 1 tablet (500 mg total) by  mouth 2 (two) times daily. (Patient not taking: Reported on 02/26/2018)  . [DISCONTINUED] metroNIDAZOLE (FLAGYL) 500 MG tablet Take 1 tablet (500 mg total) by mouth 2 (two) times daily. (Patient not taking: Reported on 02/26/2018)  . [DISCONTINUED] Prenatal Vit-DSS-Fe Cbn-FA (PRENATAL MULTIVITAMIN-ULTRA PO) Take 1 tablet by mouth daily.  . [DISCONTINUED] zolpidem (AMBIEN CR) 12.5 MG CR tablet Take 12.5 mg by mouth at bedtime as needed for sleep.   No facility-administered encounter medications on file as of 03/17/2018.    Medical History: Past Medical History:  Diagnosis Date  . Anemia   . Depression   . Gestational diabetes   . Hyperlipidemia   . Insomnia    Vital Signs: BP (!) 150/90   Pulse (!) 108   Temp 98.2 F (36.8 C)   Resp 16   Ht 4\' 11"  (1.499 m)   Wt 163 lb 12.8 oz (74.3 kg)   SpO2 92%   BMI 33.08 kg/m   Review of Systems  Constitutional: Negative for chills, fatigue and unexpected weight change.  HENT: Negative for congestion, rhinorrhea, sneezing and sore throat.   Eyes: Negative for photophobia, pain and redness.  Respiratory: Negative for cough, chest tightness and shortness of breath.   Cardiovascular: Negative for chest pain and palpitations.  Gastrointestinal: Positive for abdominal pain and nausea. Negative for blood in stool, constipation, diarrhea and vomiting.  Endocrine: Negative.   Genitourinary: Negative for dysuria and frequency.  Musculoskeletal: Negative for arthralgias, back pain, joint swelling  and neck pain.  Skin: Negative for rash.  Allergic/Immunologic: Negative.   Neurological: Positive for headaches. Negative for tremors and numbness.  Hematological: Negative for adenopathy. Does not bruise/bleed easily.  Psychiatric/Behavioral: Negative for behavioral problems and sleep disturbance. The patient is not nervous/anxious.     Physical Exam  Constitutional: She is oriented to person, place, and time. She appears well-developed and  well-nourished. No distress.  HENT:  Head: Normocephalic and atraumatic.  Mouth/Throat: Oropharynx is clear and moist. No oropharyngeal exudate.  Eyes: Pupils are equal, round, and reactive to light. EOM are normal.  Neck: Normal range of motion. Neck supple. No JVD present. No tracheal deviation present. No thyromegaly present.  Cardiovascular: Normal rate, regular rhythm and normal heart sounds. Exam reveals no gallop and no friction rub.  No murmur heard. Pulmonary/Chest: Effort normal and breath sounds normal. No respiratory distress. She has no wheezes. She has no rales. She exhibits no tenderness.  Abdominal: Soft. There is no tenderness. There is no guarding.  Musculoskeletal: Normal range of motion.  Lymphadenopathy:    She has no cervical adenopathy.  Neurological: She is alert and oriented to person, place, and time. No cranial nerve deficit.  Skin: Skin is warm and dry. She is not diaphoretic.  Psychiatric: She has a normal mood and affect. Her behavior is normal. Judgment and thought content normal.  Nursing note and vitals reviewed.  Assessment/Plan: 1. Epigastric abdominal pain Worse with palpation. Will follow up in 2 weeks. Might need UGI or /US of the abdomen depending on her symptoms  - omeprazole (PRILOSEC) 20 MG capsule; Take 1 capsule (20 mg total) by mouth daily.  Dispense: 30 capsule; Refill: 3  2. Moderate obesity Obesity Counseling: Risk Assessment: An assessment of behavioral risk factors was made today and includes lack of exercise sedentary lifestyle, lack of portion control and poor dietary habits.  Risk Modification Advice: She was counseled on portion control guidelines. Restricting daily caloric intake to. . The detrimental long term effects of obesity on her health and ongoing poor compliance was also discussed with the patient. 3. Urinary tract infection with hematuria, site unspecified Ketones, blood and protein present in urine.  Antibiotics ordered,  and awaiting C&S.  - CULTURE, URINE COMPREHENSIVE - sulfamethoxazole-trimethoprim (BACTRIM,SEPTRA) 400-80 MG tablet; Take 1 tablet by mouth 2 (two) times daily for 10 days.  Dispense: 20 tablet; Refill: 0 4. Dysuria - POCT Urinalysis Dipstick  5. Elevated blood pressure reading BP is elevated along with tachycardia, pain might be a contributing factor, will monitor   General Counseling: Rehmat verbalizes understanding of the findings of todays visit and agrees with plan of treatment. I have discussed any further diagnostic evaluation that may be needed or ordered today. We also reviewed her medications today. she has been encouraged to call the office with any questions or concerns that should arise related to todays visit.  Orders Placed This Encounter  Procedures  . CULTURE, URINE COMPREHENSIVE  . POCT Urinalysis Dipstick    Meds ordered this encounter  Medications  . omeprazole (PRILOSEC) 20 MG capsule    Sig: Take 1 capsule (20 mg total) by mouth daily.    Dispense:  30 capsule    Refill:  3  . sulfamethoxazole-trimethoprim (BACTRIM,SEPTRA) 400-80 MG tablet    Sig: Take 1 tablet by mouth 2 (two) times daily for 10 days.    Dispense:  20 tablet    Refill:  0    Time spent: 25 Minutes  This patient was seen  by Blima Ledger AGNP-C in Collaboration with Dr Lyndon Code as a part of collaborative care agreement

## 2018-03-17 NOTE — Patient Instructions (Signed)

## 2018-03-20 LAB — CULTURE, URINE COMPREHENSIVE

## 2018-03-31 ENCOUNTER — Ambulatory Visit: Payer: Self-pay | Admitting: Adult Health

## 2018-04-23 ENCOUNTER — Ambulatory Visit: Payer: Self-pay | Admitting: Nurse Practitioner

## 2018-04-30 ENCOUNTER — Ambulatory Visit: Payer: Self-pay | Admitting: Nurse Practitioner

## 2018-05-28 ENCOUNTER — Ambulatory Visit: Payer: Commercial Managed Care - PPO | Admitting: Nurse Practitioner

## 2018-05-28 ENCOUNTER — Encounter: Payer: Self-pay | Admitting: Nurse Practitioner

## 2018-05-28 VITALS — BP 127/88 | HR 102 | Resp 16 | Ht 59.0 in | Wt 154.2 lb

## 2018-05-28 DIAGNOSIS — G5601 Carpal tunnel syndrome, right upper limb: Secondary | ICD-10-CM | POA: Diagnosis not present

## 2018-05-28 DIAGNOSIS — E668 Other obesity: Secondary | ICD-10-CM

## 2018-05-28 DIAGNOSIS — R319 Hematuria, unspecified: Secondary | ICD-10-CM

## 2018-05-28 DIAGNOSIS — R809 Proteinuria, unspecified: Secondary | ICD-10-CM | POA: Diagnosis not present

## 2018-05-28 LAB — POCT URINALYSIS DIPSTICK
Bilirubin, UA: NEGATIVE
Glucose, UA: NEGATIVE
Ketones, UA: NEGATIVE
LEUKOCYTES UA: NEGATIVE
NITRITE UA: NEGATIVE
PH UA: 6 (ref 5.0–8.0)
PROTEIN UA: POSITIVE — AB
SPEC GRAV UA: 1.02 (ref 1.010–1.025)
UROBILINOGEN UA: 0.2 U/dL

## 2018-05-28 MED ORDER — PHENTERMINE HCL 37.5 MG PO TABS: 37.5000 mg | ORAL_TABLET | Freq: Every day | ORAL | 1 refills | Status: DC

## 2018-05-28 NOTE — Progress Notes (Signed)
[facility] [address] [place], [place] [address]  Internal MEDICINE  Office Visit Note  Patient Name: Rebecca Ponce  [id number]  [id number]  Date of Service: 05/30/2018  Chief Complaint  Patient presents with  . Medical Management of Chronic Issues    6wk follow up weight management  . Urinary Tract Infection    recheck urine, pt had blood and protein in last urine wanted to see if the antibiotic was clearing it up  . Quality Metric Gaps    pt is not interested in the flu or pneumonia vaccine    The patient is here for routine follow up of weight management. She has lost 9 pounds since she was last seen. Is taking phentermine on the work weeks. Breaks from it on the weekends. She is closely monitoring her diet. She limits her calorie intake to 1200-1500 calories per week. She is more active every day. The past few visits, she had complaint of abdominal pain. There has been both blood in protein in her urine. Today, she continues to have protein and moderate blood in her urine. She denies abdominal pain or cramping. She is currently not having a menstrual cycle. She continues to take lansoprazole every day which does help to manage epigastric pain and heartburn type symptoms.  Today, she is c/o right hand tingling and numbness. Started in the palm of her hand and will sometimes travel up to her thumb. This is making it difficult for her to hold and manage her tools at work. Strength and ROM of the right hand are reduced. She has been on light duty at work, but needs to be evaluated by orthopedics for further evaluationn.       Current Medication: Outpatient Encounter Medications as of 05/28/2018  Medication Sig  . drospirenone-ethinyl estradiol (YAZ) 3-0.02 MG tablet Take 1 tablet by mouth daily.  Marland Kitchen FLUoxetine HCl 60 MG TABS Take 1 tablet by mouth daily.  Marland Kitchen ibuprofen (ADVIL,MOTRIN) 600 MG tablet Take 1 tablet (600 mg total) by mouth every 6 (six) hours as needed for  fever or headache.  . lansoprazole (PREVACID) 30 MG capsule Take 1 capsule (30 mg total) by mouth daily at 12 noon.  Marland Kitchen omeprazole (PRILOSEC) 20 MG capsule Take 1 capsule (20 mg total) by mouth daily.  . phentermine (ADIPEX-P) 37.5 MG tablet Take 1 tablet (37.5 mg total) by mouth daily before breakfast.  . [DISCONTINUED] phentermine (ADIPEX-P) 37.5 MG tablet Take 1 tablet (37.5 mg total) by mouth daily before breakfast.  . [DISCONTINUED] oxyCODONE-acetaminophen (PERCOCET/ROXICET) 5-325 MG tablet Take 1-2 tablets by mouth every 6 (six) hours as needed for moderate pain or severe pain. (Patient not taking: Reported on 03/17/2018)   No facility-administered encounter medications on file as of 05/28/2018.     Surgical History: Past Surgical History:  Procedure Laterality Date  . CESAREAN SECTION     twice  . CESAREAN SECTION    . CESAREAN SECTION WITH BILATERAL TUBAL LIGATION N/A 06/11/2015   Procedure: CESAREAN SECTION WITH BILATERAL TUBAL LIGATION;  Surgeon: Vena Austria, MD;  Location: [facility] ORS;  Service: Obstetrics;  Laterality: N/A;  . CHOLECYSTECTOMY    . TONSILLECTOMY    . TONSILLECTOMY      Medical History: Past Medical History:  Diagnosis Date  . Anemia   . Depression   . Gestational diabetes   . Hyperlipidemia   . Insomnia     Family History: Family History  Problem Relation Age of Onset  . Anxiety disorder Mother   .  Diabetes Mother   . Hypertension Father   . Diabetes Maternal Aunt     Social History   Socioeconomic History  . Marital status: Married    Spouse name: Not on file  . Number of children: Not on file  . Years of education: Not on file  . Highest education level: Not on file  Occupational History  . Not on file  Social Needs  . Financial resource strain: Not on file  . Food insecurity:    Worry: Not on file    Inability: Not on file  . Transportation needs:    Medical: Not on file    Non-medical: Not on file  Tobacco Use  . Smoking  status: Never Smoker  . Smokeless tobacco: Never Used  . Tobacco comment: years ago  Substance and Sexual Activity  . Alcohol use: Yes    Comment: socially   . Drug use: Never  . Sexual activity: Not on file  Lifestyle  . Physical activity:    Days per week: Not on file    Minutes per session: Not on file  . Stress: Not on file  Relationships  . Social connections:    Talks on phone: Not on file    Gets together: Not on file    Attends religious service: Not on file    Active member of club or organization: Not on file    Attends meetings of clubs or organizations: Not on file    Relationship status: Not on file  . Intimate partner violence:    Fear of current or ex partner: Not on file    Emotionally abused: Not on file    Physically abused: Not on file    Forced sexual activity: Not on file  Other Topics Concern  . Not on file  Social History Narrative   ** Merged History Encounter **          Review of Systems  Constitutional: Positive for fatigue. Negative for activity change, chills and unexpected weight change.       Weight loss of 9 pounds since her last visit.   HENT: Negative for congestion, postnasal drip, rhinorrhea, sneezing and sore throat.   Eyes: Negative.  Negative for redness.  Respiratory: Negative for cough, chest tightness, shortness of breath and wheezing.   Cardiovascular: Negative for chest pain and palpitations.  Gastrointestinal: Negative for abdominal pain, constipation, diarrhea, nausea and vomiting.       Improved GERD and heartburn symptoms.   Endocrine: Negative for cold intolerance, heat intolerance, polydipsia, polyphagia and polyuria.  Genitourinary: Positive for hematuria. Negative for dysuria, frequency and menstrual problem.  Musculoskeletal: Positive for arthralgias. Negative for back pain, joint swelling and neck pain.       Numbness and tingling in the palm of the right hand radiating to the right thumb and first two fingers. Grip  strength and ROM are diminished.   Skin: Negative for rash.       Continues to have some facial acne.   Allergic/Immunologic: Negative for environmental allergies.  Neurological: Negative for dizziness, tremors, numbness and headaches.  Hematological: Negative for adenopathy. Does not bruise/bleed easily.  Psychiatric/Behavioral: Positive for dysphoric mood and sleep disturbance. Negative for behavioral problems (Depression) and suicidal ideas. The patient is nervous/anxious.    Today's Vitals   05/28/18 1358  BP: 127/88  Pulse: (!) 102  Resp: 16  SpO2: 97%  Weight: 154 lb 3.2 oz (69.9 kg)  Height: 4\' 11"  (1.499 m)  Physical Exam  Constitutional: She is oriented to person, place, and time. She appears well-developed and well-nourished. No distress.  HENT:  Head: Normocephalic and atraumatic.  Mouth/Throat: No oropharyngeal exudate.  Eyes: Pupils are equal, round, and reactive to light. EOM are normal.  Neck: Normal range of motion. Neck supple. No JVD present. No tracheal deviation present. No thyromegaly present.  Cardiovascular: Normal rate, regular rhythm and normal heart sounds. Exam reveals no gallop and no friction rub.  No murmur heard. Pulmonary/Chest: Effort normal and breath sounds normal. No respiratory distress. She has no wheezes. She has no rales. She exhibits no tenderness.  Abdominal: Soft. Bowel sounds are normal. There is no guarding.  Genitourinary:  Genitourinary Comments: Urinalysis is positive for protein and moderate blood.   Musculoskeletal: Normal range of motion.  Grip strength and ROM of the right hand diminished. No visible or palpable abnormalities noted at this time.   Lymphadenopathy:    She has no cervical adenopathy.  Neurological: She is alert and oriented to person, place, and time. No cranial nerve deficit.  Skin: Skin is warm and dry. She is not diaphoretic.  Psychiatric: She has a normal mood and affect. Her behavior is normal. Judgment and  thought content normal.  Nursing note and vitals reviewed.  Assessment/Plan: 1. Hematuria, unspecified type Urine sample positive for protein and moderate blood today. Will send for culture and sensitivity. Ordered ultrasound of the bladder and kidney for further evaluation.  - POCT Urinalysis Dipstick - CULTURE, URINE COMPREHENSIVE - US Renal; Future - Koreas, retroperitnl abd,  ltd  2. Proteinuria, unspecified type Urine sample positive for protein and moderate blood today. Will send for culture and sensitivity. Ordered ultrasound of the bladder and kidney for further evaluation.  - US Renal; Future - Koreas, retroperitnl abd,  ltd  3. Carpal tunnel syndrome on right Reduced grip strength and ROM of the right hand. Currently on light duty at work. Will refer to orthopedics for further evaluation and treatment of this condition. - Ambulatory referral to Orthopedic Surgery  4. Moderate obesity Improving. May continue phentermine 37.5mg  tablets. Limit calorie intake to 1200-1500 calories per day and continue with regular exercise in daily routine.  - phentermine (ADIPEX-P) 37.5 MG tablet; Take 1 tablet (37.5 mg total) by mouth daily before breakfast.  Dispense: 30 tablet; Refill: 1  General Counseling: Dalma verbalizes understanding of the findings of todays visit and agrees with plan of treatment. I have discussed any further diagnostic evaluation that may be needed or ordered today. We also reviewed her medications today. she has been encouraged to call the office with any questions or concerns that should arise related to todays visit.   There is a liability release in patients' chart. There has been a 10 minute discussion about the side effects including but not limited to elevated blood pressure, anxiety, lack of sleep and dry mouth. Pt understands and will like to start/continue on appetite suppressant at this time. There will be one month RX given at the time of visit with proper follow  up. Nova diet plan with restricted calories is given to the pt. Pt understands and agrees with  plan of treatment  This patient was seen by Vincent GrosHeather Lenora Gomes FNP Collaboration with Dr Lyndon CodeFozia M Khan as a part of collaborative care agreement   Orders Placed This Encounter  Procedures  . CULTURE, URINE COMPREHENSIVE  . US Renal  . Ambulatory referral to Orthopedic Surgery  . Koreas, retroperitnl abd,  ltd  . POCT  Urinalysis Dipstick    Meds ordered this encounter  Medications  . phentermine (ADIPEX-P) 37.5 MG tablet    Sig: Take 1 tablet (37.5 mg total) by mouth daily before breakfast.    Dispense:  30 tablet    Refill:  1    Order Specific Question:   Supervising Provider    Answer:   Lyndon Code [[id number]]    Time spent: 65 Minutes      Dr Lyndon Code Internal medicine

## 2018-05-30 DIAGNOSIS — R319 Hematuria, unspecified: Secondary | ICD-10-CM | POA: Insufficient documentation

## 2018-05-30 DIAGNOSIS — G5601 Carpal tunnel syndrome, right upper limb: Secondary | ICD-10-CM | POA: Insufficient documentation

## 2018-05-30 DIAGNOSIS — R809 Proteinuria, unspecified: Secondary | ICD-10-CM | POA: Insufficient documentation

## 2018-05-30 LAB — CULTURE, URINE COMPREHENSIVE

## 2018-05-31 ENCOUNTER — Telehealth: Payer: Self-pay

## 2018-05-31 ENCOUNTER — Other Ambulatory Visit: Payer: Self-pay | Admitting: Nurse Practitioner

## 2018-05-31 DIAGNOSIS — R319 Hematuria, unspecified: Principal | ICD-10-CM

## 2018-05-31 DIAGNOSIS — N390 Urinary tract infection, site not specified: Secondary | ICD-10-CM

## 2018-05-31 MED ORDER — NITROFURANTOIN MONOHYD MACRO 100 MG PO CAPS: 100.0000 mg | ORAL_CAPSULE | Freq: Two times a day (BID) | ORAL | 0 refills | Status: DC

## 2018-05-31 NOTE — Telephone Encounter (Signed)
Pt returned the call and she was advised of her results as well as her rx was sent to her pharmacy

## 2018-05-31 NOTE — Telephone Encounter (Signed)
Called pt to inform her of urine results left a message for her to call back for results.

## 2018-05-31 NOTE — Progress Notes (Signed)
Urine sample did show infection with normla urogenital flora. I added macrobid which is antibiotic for UTI. This is twice daily. I sent this to her pharmacy.

## 2018-05-31 NOTE — Progress Notes (Signed)
Urine sample did show infection with normla urogenital flora. I added macrobid which is antibiotic for UTI. This is twice daily. I sent this to her pharmacy.  

## 2018-05-31 NOTE — Telephone Encounter (Signed)
-----   Message from Carlean JewsHeather E Boscia, NP sent at 05/31/2018  8:36 AM EST ----- Urine sample did show infection with normla urogenital flora. I added macrobid which is antibiotic for UTI. This is twice daily. I sent this to her pharmacy.

## 2018-06-07 ENCOUNTER — Other Ambulatory Visit: Payer: Self-pay | Admitting: Orthopaedic Surgery

## 2018-06-07 DIAGNOSIS — R2231 Localized swelling, mass and lump, right upper limb: Secondary | ICD-10-CM

## 2018-06-14 ENCOUNTER — Ambulatory Visit: Payer: Commercial Managed Care - PPO

## 2018-06-25 ENCOUNTER — Ambulatory Visit: Payer: Commercial Managed Care - PPO

## 2018-06-25 DIAGNOSIS — R319 Hematuria, unspecified: Secondary | ICD-10-CM | POA: Diagnosis not present

## 2018-06-25 DIAGNOSIS — R809 Proteinuria, unspecified: Secondary | ICD-10-CM

## 2018-07-02 ENCOUNTER — Ambulatory Visit: Payer: Commercial Managed Care - PPO | Admitting: Nurse Practitioner

## 2018-07-02 ENCOUNTER — Encounter: Payer: Self-pay | Admitting: Nurse Practitioner

## 2018-07-02 VITALS — BP 125/85 | HR 100 | Resp 16 | Ht 59.0 in | Wt 153.0 lb

## 2018-07-02 DIAGNOSIS — F329 Major depressive disorder, single episode, unspecified: Secondary | ICD-10-CM

## 2018-07-02 DIAGNOSIS — E668 Other obesity: Secondary | ICD-10-CM

## 2018-07-02 DIAGNOSIS — R319 Hematuria, unspecified: Secondary | ICD-10-CM | POA: Diagnosis not present

## 2018-07-02 DIAGNOSIS — N390 Urinary tract infection, site not specified: Secondary | ICD-10-CM

## 2018-07-02 DIAGNOSIS — R300 Dysuria: Secondary | ICD-10-CM | POA: Diagnosis not present

## 2018-07-02 LAB — POCT URINALYSIS DIPSTICK
BILIRUBIN UA: NEGATIVE
GLUCOSE UA: NEGATIVE
KETONES UA: NEGATIVE
Leukocytes, UA: NEGATIVE
Nitrite, UA: NEGATIVE
Protein, UA: POSITIVE — AB
SPEC GRAV UA: 1.02 (ref 1.010–1.025)
Urobilinogen, UA: 0.2 E.U./dL
pH, UA: 6.5 (ref 5.0–8.0)

## 2018-07-02 MED ORDER — PHENTERMINE HCL 37.5 MG PO TABS: 37.5000 mg | ORAL_TABLET | Freq: Every day | ORAL | 1 refills | Status: DC

## 2018-07-02 NOTE — Progress Notes (Signed)
[facility] [address] [place], [place] [address]  Internal MEDICINE  Office Visit Note  Patient Name: Rebecca Ponce  [birth year]  [id number]  Date of Service: 07/02/2018  Chief Complaint  Patient presents with  . Medical Management of Chronic Issues    4wk follow up  . Labs Only    review ultrasound    The patient is here for ultrasound follow up. She has had recurrent urinary tract infections and moderate blood in the urine for past few visits. She had ultrasound of kidneys and bladder. Results were completely normal. She recently finished seven days of macrobid. She denies dysuria, flank pain, or abdominal or pelvic pain.  She is also taking phentermine to help with weight management. She has lost an additional pound since her last visit. Has no negative symptoms associated with taking the appetite suppressant. She is consuming a 1200 calorie diet and exercising everyday.      Current Medication: Outpatient Encounter Medications as of 07/02/2018  Medication Sig  . drospirenone-ethinyl estradiol (YAZ) 3-0.02 MG tablet Take 1 tablet by mouth daily.  Marland Kitchen. FLUoxetine HCl 60 MG TABS Take 1 tablet by mouth daily.  Marland Kitchen. ibuprofen (ADVIL,MOTRIN) 600 MG tablet Take 1 tablet (600 mg total) by mouth every 6 (six) hours as needed for fever or headache.  . lansoprazole (PREVACID) 30 MG capsule Take 1 capsule (30 mg total) by mouth daily at 12 noon.  Marland Kitchen. omeprazole (PRILOSEC) 20 MG capsule Take 1 capsule (20 mg total) by mouth daily.  . phentermine (ADIPEX-P) 37.5 MG tablet Take 1 tablet (37.5 mg total) by mouth daily before breakfast.  . [DISCONTINUED] phentermine (ADIPEX-P) 37.5 MG tablet Take 1 tablet (37.5 mg total) by mouth daily before breakfast.  . nitrofurantoin, macrocrystal-monohydrate, (MACROBID) 100 MG capsule Take 1 capsule (100 mg total) by mouth 2 (two) times daily. (Patient not taking: Reported on 07/02/2018)   No facility-administered encounter medications on file as  of 07/02/2018.     Surgical History: Past Surgical History:  Procedure Laterality Date  . CESAREAN SECTION     twice  . CESAREAN SECTION    . CESAREAN SECTION WITH BILATERAL TUBAL LIGATION N/A 06/11/2015   Procedure: CESAREAN SECTION WITH BILATERAL TUBAL LIGATION;  Surgeon: Vena AustriaAndreas Staebler, MD;  Location: [facility] ORS;  Service: Obstetrics;  Laterality: N/A;  . CHOLECYSTECTOMY    . TONSILLECTOMY    . TONSILLECTOMY      Medical History: Past Medical History:  Diagnosis Date  . Anemia   . Depression   . Gestational diabetes   . Hyperlipidemia   . Insomnia     Family History: Family History  Problem Relation Age of Onset  . Anxiety disorder Mother   . Diabetes Mother   . Hypertension Father   . Diabetes Maternal Aunt     Social History   Socioeconomic History  . Marital status: Married    Spouse name: Not on file  . Number of children: Not on file  . Years of education: Not on file  . Highest education level: Not on file  Occupational History  . Not on file  Social Needs  . Financial resource strain: Not on file  . Food insecurity:    Worry: Not on file    Inability: Not on file  . Transportation needs:    Medical: Not on file    Non-medical: Not on file  Tobacco Use  . Smoking status: Never Smoker  . Smokeless tobacco: Never Used  . Tobacco comment: years  ago  Substance and Sexual Activity  . Alcohol use: Yes    Comment: socially   . Drug use: Never  . Sexual activity: Not on file  Lifestyle  . Physical activity:    Days per week: Not on file    Minutes per session: Not on file  . Stress: Not on file  Relationships  . Social connections:    Talks on phone: Not on file    Gets together: Not on file    Attends religious service: Not on file    Active member of club or organization: Not on file    Attends meetings of clubs or organizations: Not on file    Relationship status: Not on file  . Intimate partner violence:    Fear of current or ex  partner: Not on file    Emotionally abused: Not on file    Physically abused: Not on file    Forced sexual activity: Not on file  Other Topics Concern  . Not on file  Social History Narrative   ** Merged History Encounter **          Review of Systems  Constitutional: Negative for activity change, chills, fatigue and unexpected weight change.       Weight loss of 1 pound since her last visit.   HENT: Negative for congestion, postnasal drip, rhinorrhea, sneezing and sore throat.   Eyes: Negative.   Respiratory: Negative for cough, chest tightness, shortness of breath and wheezing.   Cardiovascular: Negative for chest pain and palpitations.  Gastrointestinal: Negative for abdominal pain, constipation, diarrhea, nausea and vomiting.  Endocrine: Negative for cold intolerance, heat intolerance, polydipsia, polyphagia and polyuria.  Genitourinary: Positive for hematuria. Negative for dysuria, frequency and menstrual problem.       Recently finished round macrobid to uri  Musculoskeletal: Negative for arthralgias, back pain, joint swelling and neck pain.  Skin: Negative for rash.  Allergic/Immunologic: Negative for environmental allergies.  Neurological: Negative for dizziness, tremors, numbness and headaches.  Hematological: Negative for adenopathy. Does not bruise/bleed easily.  Psychiatric/Behavioral: Negative for behavioral problems (Depression), dysphoric mood, sleep disturbance and suicidal ideas. The patient is nervous/anxious.     Today's Vitals   07/02/18 1404  BP: 125/85  Pulse: 100  Resp: 16  SpO2: 97%  Weight: 153 lb (69.4 kg)  Height: 4\' 11"  (1.499 m)    Physical Exam Vitals signs and nursing note reviewed.  Constitutional:      General: She is not in acute distress.    Appearance: Normal appearance. She is well-developed. She is not diaphoretic.  HENT:     Head: Normocephalic and atraumatic.     Mouth/Throat:     Pharynx: No oropharyngeal exudate.  Eyes:      Pupils: Pupils are equal, round, and reactive to light.  Neck:     Musculoskeletal: Normal range of motion and neck supple.     Thyroid: No thyromegaly.     Vascular: No JVD.     Trachea: No tracheal deviation.  Cardiovascular:     Rate and Rhythm: Normal rate and regular rhythm.     Heart sounds: Normal heart sounds. No murmur. No friction rub. No gallop.   Pulmonary:     Effort: Pulmonary effort is normal. No respiratory distress.     Breath sounds: Normal breath sounds. No wheezing or rales.  Chest:     Chest wall: No tenderness.  Abdominal:     General: Bowel sounds are normal.  Palpations: Abdomen is soft.     Tenderness: There is no abdominal tenderness. There is no guarding.  Genitourinary:    Comments: Urine sample positive for protein and trace blood. Musculoskeletal: Normal range of motion.     Comments:    Lymphadenopathy:     Cervical: No cervical adenopathy.  Skin:    General: Skin is warm and dry.  Neurological:     General: No focal deficit present.     Mental Status: She is alert and oriented to person, place, and time.     Cranial Nerves: No cranial nerve deficit.  Psychiatric:        Mood and Affect: Mood normal.        Behavior: Behavior normal.        Thought Content: Thought content normal.        Judgment: Judgment normal.   Assessment/Plan: 1. Hematuria, unspecified type Reviewed ultrasound results of kidneys and bladder which were normal. U/a still showing trace blood. Will send for culture and sensitivity and treat as indicated. Consider long term antibiotic treatment if positive for infection.   2. Moderate obesity May continue phentermine 37.5mg  tablets. Limit calorie intake to 1200 calories per day and continue to incorprate exercise into daily routine.  - phentermine (ADIPEX-P) 37.5 MG tablet; Take 1 tablet (37.5 mg total) by mouth daily before breakfast.  Dispense: 30 tablet; Refill: 1  3. Major depressive disorder with single episode,  remission status unspecified Stable. Continue to monitor .  4. Dysuria U/a still showing trace blood. Will send for culture and sensitivity and treat as indicated. Consider long term antibiotic treatment if positive for infection.  - POCT Urinalysis Dipstick  General Counseling: Maely verbalizes understanding of the findings of todays visit and agrees with plan of treatment. I have discussed any further diagnostic evaluation that may be needed or ordered today. We also reviewed her medications today. she has been encouraged to call the office with any questions or concerns that should arise related to todays visit.   There is a liability release in patients' chart. There has been a 10 minute discussion about the side effects including but not limited to elevated blood pressure, anxiety, lack of sleep and dry mouth. Pt understands and will like to start/continue on appetite suppressant at this time. There will be one month RX given at the time of visit with proper follow up. Nova diet plan with restricted calories is given to the pt. Pt understands and agrees with  plan of treatment  This patient was seen by Vincent Gros FNP Collaboration with Dr Lyndon Code as a part of collaborative care agreement  Orders Placed This Encounter  Procedures  . POCT Urinalysis Dipstick    Meds ordered this encounter  Medications  . phentermine (ADIPEX-P) 37.5 MG tablet    Sig: Take 1 tablet (37.5 mg total) by mouth daily before breakfast.    Dispense:  30 tablet    Refill:  1    Order Specific Question:   Supervising Provider    Answer:   Lyndon Code [[id number]]    Time spent: 12 Minutes      Dr Lyndon Code Internal medicine

## 2018-07-02 NOTE — Addendum Note (Signed)
Addended by: Golda AcrePATEL, Yaniah Thiemann on: 07/02/2018 04:13 PM   Modules accepted: Orders

## 2018-07-08 LAB — CULTURE, URINE COMPREHENSIVE

## 2018-07-16 ENCOUNTER — Ambulatory Visit: Payer: Commercial Managed Care - PPO

## 2018-08-09 ENCOUNTER — Telehealth: Payer: Self-pay

## 2018-08-09 ENCOUNTER — Other Ambulatory Visit: Payer: Self-pay | Admitting: Nurse Practitioner

## 2018-08-09 DIAGNOSIS — B373 Candidiasis of vulva and vagina: Secondary | ICD-10-CM

## 2018-08-09 DIAGNOSIS — B3731 Acute candidiasis of vulva and vagina: Secondary | ICD-10-CM

## 2018-08-09 MED ORDER — FLUCONAZOLE 150 MG PO TABS: ORAL_TABLET | ORAL | 0 refills | Status: DC

## 2018-08-09 NOTE — Telephone Encounter (Signed)
Sent diflucan. Take one tablet once. May repeat dose in three days for persistent symptoms. Sent to [facility]

## 2018-08-09 NOTE — Progress Notes (Signed)
Sent diflucan. Take one tablet once. May repeat dose in three days for persistent symptoms. Sent to [facility]

## 2018-08-09 NOTE — Telephone Encounter (Signed)
PT WAS NOTIFIED. 

## 2018-08-13 ENCOUNTER — Ambulatory Visit: Payer: Self-pay | Admitting: Nurse Practitioner

## 2018-08-27 ENCOUNTER — Ambulatory Visit: Payer: Self-pay | Admitting: Adult Health

## 2018-12-21 ENCOUNTER — Ambulatory Visit: Payer: Commercial Managed Care - PPO | Admitting: Adult Health

## 2018-12-21 ENCOUNTER — Encounter: Payer: Self-pay | Admitting: Adult Health

## 2018-12-21 ENCOUNTER — Other Ambulatory Visit: Payer: Self-pay

## 2018-12-21 VITALS — BP 148/93 | HR 115 | Temp 99.5°F | Resp 16 | Ht 59.0 in | Wt 185.6 lb

## 2018-12-21 DIAGNOSIS — L089 Local infection of the skin and subcutaneous tissue, unspecified: Secondary | ICD-10-CM | POA: Diagnosis not present

## 2018-12-21 DIAGNOSIS — F339 Major depressive disorder, recurrent, unspecified: Secondary | ICD-10-CM

## 2018-12-21 DIAGNOSIS — R030 Elevated blood-pressure reading, without diagnosis of hypertension: Secondary | ICD-10-CM

## 2018-12-21 DIAGNOSIS — R000 Tachycardia, unspecified: Secondary | ICD-10-CM

## 2018-12-21 MED ORDER — ESCITALOPRAM OXALATE 20 MG PO TABS: 20.0000 mg | ORAL_TABLET | Freq: Every day | ORAL | 2 refills | Status: DC

## 2018-12-21 MED ORDER — ESCITALOPRAM OXALATE 10 MG PO TABS: 10.0000 mg | ORAL_TABLET | Freq: Every day | ORAL | 0 refills | Status: DC

## 2018-12-21 MED ORDER — DOXYCYCLINE HYCLATE 100 MG PO TABS: 100.0000 mg | ORAL_TABLET | Freq: Two times a day (BID) | ORAL | 0 refills | Status: DC

## 2018-12-21 NOTE — Progress Notes (Signed)
Pt blood pressure and pulse elevated, informed provider.

## 2018-12-27 ENCOUNTER — Other Ambulatory Visit: Payer: Self-pay | Admitting: Nurse Practitioner

## 2018-12-27 ENCOUNTER — Telehealth: Payer: Self-pay

## 2018-12-27 DIAGNOSIS — B373 Candidiasis of vulva and vagina: Secondary | ICD-10-CM

## 2018-12-27 DIAGNOSIS — B3731 Acute candidiasis of vulva and vagina: Secondary | ICD-10-CM

## 2018-12-27 MED ORDER — FLUCONAZOLE 150 MG PO TABS: ORAL_TABLET | ORAL | 0 refills | Status: DC

## 2018-12-27 NOTE — Progress Notes (Signed)
Patient c/o yeast infection. Sent diflucan to pharmacy. Take once. May repeat in three days for persistent symptoms. Sent to [facility].

## 2018-12-27 NOTE — Telephone Encounter (Signed)
Patient c/o yeast infection. Sent diflucan to pharmacy. Take once. May repeat in three days for persistent symptoms. Sent to [facility].  

## 2018-12-27 NOTE — Telephone Encounter (Signed)
lmom 

## 2019-01-07 NOTE — Progress Notes (Signed)
[facility] [address] [place], [place] [address]  Internal MEDICINE  Office Visit Note  Patient Name: Rebecca BentonKristy Edwards  [birth year]  [id number]  Date of Service: 12/21/2018  Chief Complaint  Patient presents with  . Pain    thumb on left hand, cut finger on glass winder there are two cuts on the finger, its painful think it may be infected, happened last thursday,     HPI  Patient reports she is using a glass winder 4 days ago and cut her thumb of her left hand.  It has become swollen, reddened and painful.  Patient is concerned that it is infected.   Current Medication: Outpatient Encounter Medications as of 12/21/2018  Medication Sig  . doxycycline (VIBRA-TABS) 100 MG tablet Take 1 tablet (100 mg total) by mouth 2 (two) times daily.  . drospirenone-ethinyl estradiol (YAZ) 3-0.02 MG tablet Take 1 tablet by mouth daily. (Patient not taking: Reported on 12/21/2018)  . escitalopram (LEXAPRO) 10 MG tablet Take 1 tablet (10 mg total) by mouth daily.  Marland Kitchen. escitalopram (LEXAPRO) 20 MG tablet Take 1 tablet (20 mg total) by mouth daily.  Marland Kitchen. FLUoxetine HCl 60 MG TABS Take 1 tablet by mouth daily. (Patient not taking: Reported on 12/21/2018)  . ibuprofen (ADVIL,MOTRIN) 600 MG tablet Take 1 tablet (600 mg total) by mouth every 6 (six) hours as needed for fever or headache. (Patient not taking: Reported on 12/21/2018)  . lansoprazole (PREVACID) 30 MG capsule Take 1 capsule (30 mg total) by mouth daily at 12 noon. (Patient not taking: Reported on 12/21/2018)  . nitrofurantoin, macrocrystal-monohydrate, (MACROBID) 100 MG capsule Take 1 capsule (100 mg total) by mouth 2 (two) times daily. (Patient not taking: Reported on 07/02/2018)  . omeprazole (PRILOSEC) 20 MG capsule Take 1 capsule (20 mg total) by mouth daily. (Patient not taking: Reported on 12/21/2018)  . phentermine (ADIPEX-P) 37.5 MG tablet Take 1 tablet (37.5 mg total) by mouth daily before breakfast. (Patient not taking: Reported on  12/21/2018)  . [DISCONTINUED] fluconazole (DIFLUCAN) 150 MG tablet Take 1 tablet po once. May repeat dose in 3 days as needed for persistent symptoms. (Patient not taking: Reported on 12/21/2018)   No facility-administered encounter medications on file as of 12/21/2018.     Surgical History: Past Surgical History:  Procedure Laterality Date  . CESAREAN SECTION     twice  . CESAREAN SECTION    . CESAREAN SECTION WITH BILATERAL TUBAL LIGATION N/A 06/11/2015   Procedure: CESAREAN SECTION WITH BILATERAL TUBAL LIGATION;  Surgeon: Vena AustriaAndreas Staebler, MD;  Location: [facility] ORS;  Service: Obstetrics;  Laterality: N/A;  . CHOLECYSTECTOMY    . TONSILLECTOMY    . TONSILLECTOMY      Medical History: Past Medical History:  Diagnosis Date  . Anemia   . Depression   . Gestational diabetes   . Hyperlipidemia   . Insomnia     Family History: Family History  Problem Relation Age of Onset  . Anxiety disorder Mother   . Diabetes Mother   . Hypertension Father   . Diabetes Maternal Aunt     Social History   Socioeconomic History  . Marital status: Married    Spouse name: Not on file  . Number of children: Not on file  . Years of education: Not on file  . Highest education level: Not on file  Occupational History  . Not on file  Social Needs  . Financial resource strain: Not on file  . Food insecurity  Worry: Not on file    Inability: Not on file  . Transportation needs    Medical: Not on file    Non-medical: Not on file  Tobacco Use  . Smoking status: Never Smoker  . Smokeless tobacco: Never Used  . Tobacco comment: years ago  Substance and Sexual Activity  . Alcohol use: Yes    Comment: socially   . Drug use: Never  . Sexual activity: Not on file  Lifestyle  . Physical activity    Days per week: Not on file    Minutes per session: Not on file  . Stress: Not on file  Relationships  . Social Herbalist on phone: Not on file    Gets together: Not on file     Attends religious service: Not on file    Active member of club or organization: Not on file    Attends meetings of clubs or organizations: Not on file    Relationship status: Not on file  . Intimate partner violence    Fear of current or ex partner: Not on file    Emotionally abused: Not on file    Physically abused: Not on file    Forced sexual activity: Not on file  Other Topics Concern  . Not on file  Social History Narrative   ** Merged History Encounter **          Review of Systems  Constitutional: Negative for chills, fatigue and unexpected weight change.  HENT: Negative for congestion, rhinorrhea, sneezing and sore throat.   Eyes: Negative for photophobia, pain and redness.  Respiratory: Negative for cough, chest tightness and shortness of breath.   Cardiovascular: Negative for chest pain and palpitations.  Gastrointestinal: Negative for abdominal pain, constipation, diarrhea, nausea and vomiting.  Endocrine: Negative.   Genitourinary: Negative for dysuria and frequency.  Musculoskeletal: Negative for arthralgias, back pain, joint swelling and neck pain.  Skin: Negative for rash.  Allergic/Immunologic: Negative.   Neurological: Negative for tremors and numbness.  Hematological: Negative for adenopathy. Does not bruise/bleed easily.  Psychiatric/Behavioral: Negative for behavioral problems and sleep disturbance. The patient is not nervous/anxious.     Vital Signs: BP (!) 148/93 (BP Location: Right Arm, Patient Position: Sitting, Cuff Size: Large)   Pulse (!) 115   Temp 99.5 F (37.5 C)   Resp 16   Ht 4\' 11"  (1.499 m)   Wt 185 lb 9.6 oz (84.2 kg)   SpO2 97%   BMI 37.49 kg/m    Physical Exam Vitals signs and nursing note reviewed.  Constitutional:      General: She is not in acute distress.    Appearance: She is well-developed. She is not diaphoretic.  HENT:     Head: Normocephalic and atraumatic.     Mouth/Throat:     Pharynx: No oropharyngeal exudate.   Eyes:     Pupils: Pupils are equal, round, and reactive to light.  Neck:     Musculoskeletal: Normal range of motion and neck supple.     Thyroid: No thyromegaly.     Vascular: No JVD.     Trachea: No tracheal deviation.  Cardiovascular:     Rate and Rhythm: Normal rate and regular rhythm.     Heart sounds: Normal heart sounds. No murmur. No friction rub. No gallop.   Pulmonary:     Effort: Pulmonary effort is normal. No respiratory distress.     Breath sounds: Normal breath sounds. No wheezing or rales.  Chest:  Chest wall: No tenderness.  Abdominal:     Palpations: Abdomen is soft.     Tenderness: There is no abdominal tenderness. There is no guarding.  Musculoskeletal: Normal range of motion.  Lymphadenopathy:     Cervical: No cervical adenopathy.  Skin:    General: Skin is warm and dry.     Comments: Laceration/puncture wound with redness and warm to touch of left hand.   Neurological:     Mental Status: She is alert and oriented to person, place, and time.     Cranial Nerves: No cranial nerve deficit.  Psychiatric:        Behavior: Behavior normal.        Thought Content: Thought content normal.        Judgment: Judgment normal.     Assessment/Plan: 1. Skin infection Advised patient to take entire course of antibiotics as prescribed with food. Pt should return to clinic in 7-10 days if symptoms fail to improve or new symptoms develop.  - doxycycline (VIBRA-TABS) 100 MG tablet; Take 1 tablet (100 mg total) by mouth 2 (two) times daily.  Dispense: 20 tablet; Refill: 0  2. Elevated pulse rate Ps HR is elevated today, with low-grade temp.  Likely due from infection.  Instructed patient to return to clinic or go to the emergency room if her pulse rate did not improve.  3. Elevated blood pressure reading BP 140/93 today.  Likely due to infection.  Instructed patient to return to clinic if she develops new or worrisome symptoms.  We also discussed go to the emergency  room if she developed chest pain, shortness of breath or other issues and her clinical  4. Depression, recurrent (HCC) Refilled patient's Lexapro at this time. - escitalopram (LEXAPRO) 10 MG tablet; Take 1 tablet (10 mg total) by mouth daily.  Dispense: 7 tablet; Refill: 0 - escitalopram (LEXAPRO) 20 MG tablet; Take 1 tablet (20 mg total) by mouth daily.  Dispense: 30 tablet; Refill: 2  General Counseling: Rajvi verbalizes understanding of the findings of todays visit and agrees with plan of treatment. I have discussed any further diagnostic evaluation that may be needed or ordered today. We also reviewed her medications today. she has been encouraged to call the office with any questions or concerns that should arise related to todays visit.    No orders of the defined types were placed in this encounter.   Meds ordered this encounter  Medications  . doxycycline (VIBRA-TABS) 100 MG tablet    Sig: Take 1 tablet (100 mg total) by mouth 2 (two) times daily.    Dispense:  20 tablet    Refill:  0  . escitalopram (LEXAPRO) 10 MG tablet    Sig: Take 1 tablet (10 mg total) by mouth daily.    Dispense:  7 tablet    Refill:  0    Take 10mg  daily for 7 days and then increase to 20mg  daily.  Marland Kitchen. escitalopram (LEXAPRO) 20 MG tablet    Sig: Take 1 tablet (20 mg total) by mouth daily.    Dispense:  30 tablet    Refill:  2    Time spent: 20 Minutes   This patient was seen by Blima LedgerAdam Bert Givans AGNP-C in Collaboration with Dr Lyndon CodeFozia M Khan as a part of collaborative care agreement     Johnna AcostaAdam J. Joeanne Robicheaux AGNP-C Internal medicine

## 2019-02-03 ENCOUNTER — Other Ambulatory Visit: Payer: Self-pay

## 2019-02-03 ENCOUNTER — Encounter: Payer: Self-pay | Admitting: Nurse Practitioner

## 2019-02-03 ENCOUNTER — Ambulatory Visit (INDEPENDENT_AMBULATORY_CARE_PROVIDER_SITE_OTHER): Payer: Commercial Managed Care - PPO | Admitting: Nurse Practitioner

## 2019-02-03 VITALS — BP 133/81 | HR 114 | Resp 16 | Ht 59.0 in | Wt 186.6 lb

## 2019-02-03 DIAGNOSIS — Z0001 Encounter for general adult medical examination with abnormal findings: Secondary | ICD-10-CM | POA: Diagnosis not present

## 2019-02-03 DIAGNOSIS — R5383 Other fatigue: Secondary | ICD-10-CM

## 2019-02-03 DIAGNOSIS — F339 Major depressive disorder, recurrent, unspecified: Secondary | ICD-10-CM | POA: Diagnosis not present

## 2019-02-03 DIAGNOSIS — Z124 Encounter for screening for malignant neoplasm of cervix: Secondary | ICD-10-CM | POA: Diagnosis not present

## 2019-02-03 DIAGNOSIS — R300 Dysuria: Secondary | ICD-10-CM

## 2019-02-03 MED ORDER — BUPROPION HCL 100 MG PO TABS: 100.0000 mg | ORAL_TABLET | Freq: Every day | ORAL | 3 refills | Status: DC

## 2019-02-03 NOTE — Progress Notes (Signed)
[facility] [address] [place], [place] [address]  Internal MEDICINE  Office Visit Note  Patient Name: Rebecca BentonKristy Ponce  1992  [id number]  Date of Service: 02/19/2019   Pt is here for routine health maintenance examination  Chief Complaint  Patient presents with  . Annual Exam  . Gynecologic Exam  . Hyperlipidemia     The patient is here for health maintenance exam. She has had several issues going on for the last few months. In February, her father-in-law passed away. Two weeks after that, her father committed suicide. She has taken herself off all of her medications. Having trouble processing all the loss she has suffered. Currently on leave of absence from her job.     Current Medication: Outpatient Encounter Medications as of 02/03/2019  Medication Sig  . buPROPion (WELLBUTRIN) 100 MG tablet Take 1 tablet (100 mg total) by mouth daily at 6 (six) AM.  . [DISCONTINUED] doxycycline (VIBRA-TABS) 100 MG tablet Take 1 tablet (100 mg total) by mouth 2 (two) times daily. (Patient not taking: Reported on 02/03/2019)  . [DISCONTINUED] drospirenone-ethinyl estradiol (YAZ) 3-0.02 MG tablet Take 1 tablet by mouth daily. (Patient not taking: Reported on 12/21/2018)  . [DISCONTINUED] escitalopram (LEXAPRO) 10 MG tablet Take 1 tablet (10 mg total) by mouth daily. (Patient not taking: Reported on 02/03/2019)  . [DISCONTINUED] escitalopram (LEXAPRO) 20 MG tablet Take 1 tablet (20 mg total) by mouth daily. (Patient not taking: Reported on 02/03/2019)  . [DISCONTINUED] fluconazole (DIFLUCAN) 150 MG tablet Take 1 tablet po once. May repeat dose in 3 days as needed for persistent symptoms. (Patient not taking: Reported on 02/03/2019)  . [DISCONTINUED] FLUoxetine HCl 60 MG TABS Take 1 tablet by mouth daily. (Patient not taking: Reported on 12/21/2018)  . [DISCONTINUED] ibuprofen (ADVIL,MOTRIN) 600 MG tablet Take 1 tablet (600 mg total) by mouth every 6 (six) hours as needed for fever or  headache. (Patient not taking: Reported on 12/21/2018)  . [DISCONTINUED] lansoprazole (PREVACID) 30 MG capsule Take 1 capsule (30 mg total) by mouth daily at 12 noon. (Patient not taking: Reported on 12/21/2018)  . [DISCONTINUED] nitrofurantoin, macrocrystal-monohydrate, (MACROBID) 100 MG capsule Take 1 capsule (100 mg total) by mouth 2 (two) times daily. (Patient not taking: Reported on 07/02/2018)  . [DISCONTINUED] omeprazole (PRILOSEC) 20 MG capsule Take 1 capsule (20 mg total) by mouth daily. (Patient not taking: Reported on 12/21/2018)  . [DISCONTINUED] phentermine (ADIPEX-P) 37.5 MG tablet Take 1 tablet (37.5 mg total) by mouth daily before breakfast. (Patient not taking: Reported on 12/21/2018)   No facility-administered encounter medications on file as of 02/03/2019.     Surgical History: Past Surgical History:  Procedure Laterality Date  . CESAREAN SECTION     twice  . CESAREAN SECTION    . CESAREAN SECTION WITH BILATERAL TUBAL LIGATION N/A 06/11/2015   Procedure: CESAREAN SECTION WITH BILATERAL TUBAL LIGATION;  Surgeon: Vena AustriaAndreas Staebler, MD;  Location: [facility] ORS;  Service: Obstetrics;  Laterality: N/A;  . CHOLECYSTECTOMY    . TONSILLECTOMY    . TONSILLECTOMY      Medical History: Past Medical History:  Diagnosis Date  . Anemia   . Depression   . Gestational diabetes   . Hyperlipidemia   . Insomnia     Family History: Family History  Problem Relation Age of Onset  . Anxiety disorder Mother   . Diabetes Mother   . Hypertension Father   . Diabetes Maternal Aunt       Review of Systems  Constitutional:  Positive for fatigue. Negative for chills and unexpected weight change.  HENT: Negative for congestion, postnasal drip, rhinorrhea, sneezing and sore throat.   Respiratory: Negative for cough, chest tightness, shortness of breath and wheezing.   Cardiovascular: Negative for chest pain and palpitations.  Gastrointestinal: Negative for abdominal pain, constipation, diarrhea,  nausea and vomiting.  Endocrine: Negative for cold intolerance, heat intolerance, polydipsia and polyuria.  Genitourinary: Negative for dysuria, flank pain, frequency, menstrual problem and urgency.  Musculoskeletal: Negative for arthralgias, back pain, joint swelling and neck pain.  Skin: Negative for rash.  Allergic/Immunologic: Negative.   Neurological: Negative for dizziness, tremors, numbness and headaches.  Hematological: Negative for adenopathy. Does not bruise/bleed easily.  Psychiatric/Behavioral: Positive for dysphoric mood. Negative for behavioral problems (Depression), sleep disturbance and suicidal ideas. The patient is nervous/anxious.      Today's Vitals   02/03/19 1528  BP: 133/81  Pulse: (!) 114  Resp: 16  SpO2: 99%  Weight: 186 lb 9.6 oz (84.6 kg)  Height: 4\' 11"  (1.499 m)   Body mass index is 37.69 kg/m.  Physical Exam Vitals signs and nursing note reviewed.  Constitutional:      General: She is not in acute distress.    Appearance: Normal appearance. She is well-developed. She is not diaphoretic.  HENT:     Head: Normocephalic and atraumatic.     Mouth/Throat:     Pharynx: No oropharyngeal exudate.  Eyes:     Extraocular Movements: Extraocular movements intact.     Conjunctiva/sclera: Conjunctivae normal.     Pupils: Pupils are equal, round, and reactive to light.  Neck:     Musculoskeletal: Normal range of motion and neck supple.     Thyroid: No thyromegaly.     Vascular: No JVD.     Trachea: No tracheal deviation.  Cardiovascular:     Rate and Rhythm: Normal rate and regular rhythm.     Pulses: Normal pulses.     Heart sounds: Normal heart sounds. No murmur. No friction rub. No gallop.   Pulmonary:     Effort: Pulmonary effort is normal. No respiratory distress.     Breath sounds: Normal breath sounds. No wheezing or rales.  Chest:     Chest wall: No tenderness.     Breasts:        Right: Normal. No swelling, bleeding, inverted nipple, mass,  nipple discharge, skin change or tenderness.        Left: Normal. No swelling, bleeding, inverted nipple, mass, nipple discharge, skin change or tenderness.  Abdominal:     General: Bowel sounds are normal.     Palpations: Abdomen is soft.     Tenderness: There is no abdominal tenderness.     Hernia: There is no hernia in the left inguinal area or right inguinal area.  Genitourinary:    General: Normal vulva.     Exam position: Supine.     Labia:        Right: No tenderness or lesion.        Left: No tenderness or lesion.      Vagina: No vaginal discharge, erythema or tenderness.     Cervix: No cervical motion tenderness, discharge, friability or erythema.     Uterus: Normal.      Adnexa: Right adnexa normal and left adnexa normal.       Right: No mass or tenderness.         Left: No mass or tenderness.    Musculoskeletal: Normal range of motion.  Lymphadenopathy:     Cervical: No cervical adenopathy.     Lower Body: No right inguinal adenopathy. No left inguinal adenopathy.  Skin:    General: Skin is warm and dry.  Neurological:     Mental Status: She is alert and oriented to person, place, and time.     Cranial Nerves: No cranial nerve deficit.  Psychiatric:        Attention and Perception: Attention and perception normal.        Mood and Affect: Mood is anxious and depressed.        Speech: Speech normal.        Behavior: Behavior normal.        Thought Content: Thought content normal.        Cognition and Memory: Cognition and memory normal.        Judgment: Judgment normal.      LABS: Recent Results (from the past 2160 hour(s))  Pap IG and HPV (high risk) DNA detection     Status: None   Collection Time: 02/03/19  3:32 PM  Result Value Ref Range   Interpretation NILM     Comment: NEGATIVE FOR INTRAEPITHELIAL LESION OR MALIGNANCY.   Category NIL     Comment: Negative for Intraepithelial Lesion   Adequacy SECNI     Comment: Satisfactory for evaluation. No  endocervical component is identified.   Clinician Provided ICD10 Comment     Comment: Z12.4   Performed by: Comment     Comment: Park Breed, Cytotechnologist (ASCP)   Note: Comment     Comment: The Pap smear is a screening test designed to aid in the detection of premalignant and malignant conditions of the uterine cervix.  It is not a diagnostic procedure and should not be used as the sole means of detecting cervical cancer.  Both false-positive and false-negative reports do occur.    Test Methodology Comment     Comment: This liquid based ThinPrep(R) pap test was screened with the use of an image guided system.    HPV, high-risk Negative Negative    Comment: This nucleic acid amplification high-risk HPV test detects thirteen high-risk types (16,18,31,33,35,39,45,51,52,56,58,59,68) without differentiation.   UA/M w/rflx Culture, Routine     Status: None   Collection Time: 02/03/19  3:33 PM   Specimen: Urine   URINE  Result Value Ref Range   Specific Gravity, UA 1.009 1.005 - 1.030   pH, UA 5.0 5.0 - 7.5   Color, UA Yellow Yellow   Appearance Ur Clear Clear   Leukocytes,UA Negative Negative   Protein,UA Negative Negative/Trace   Glucose, UA Negative Negative   Ketones, UA Negative Negative   RBC, UA Negative Negative   Bilirubin, UA Negative Negative   Urobilinogen, Ur 0.2 0.2 - 1.0 mg/dL   Nitrite, UA Negative Negative   Microscopic Examination Comment     Comment: Microscopic follows if indicated.   Microscopic Examination See below:     Comment: Microscopic was indicated and was performed.   Urinalysis Reflex Comment     Comment: This specimen will not reflex to a Urine Culture.  Microscopic Examination     Status: None   Collection Time: 02/03/19  3:33 PM   URINE  Result Value Ref Range   WBC, UA 0-5 0 - 5 /hpf   RBC 0-2 0 - 2 /hpf   Epithelial Cells (non renal) 0-10 0 - 10 /hpf   Casts None seen None seen /lpf   Mucus, UA Present Not  Estab.   Bacteria,  UA Few None seen/Few    Assessment/Plan: 1. Encounter for general adult medical examination with abnormal findings Annual health minutemen exam today.   2. Other fatigue Likely due to increased depression due to loss of her father. Will monitor.   3. Depression, recurrent (HCC) Start wellbutrin 100mg  daily. Reassess at next visit.  - buPROPion (WELLBUTRIN) 100 MG tablet; Take 1 tablet (100 mg total) by mouth daily at 6 (six) AM.  Dispense: 30 tablet; Refill: 3  4. Routine cervical smear - Pap IG and HPV (high risk) DNA detection  5. Dysuria - UA/M w/rflx Culture, Routine  General Counseling: Kieley verbalizes understanding of the findings of todays visit and agrees with plan of treatment. I have discussed any further diagnostic evaluation that may be needed or ordered today. We also reviewed her medications today. she has been encouraged to call the office with any questions or concerns that should arise related to todays visit.    Counseling:  This patient was seen by Vincent GrosHeather Lissett Favorite FNP Collaboration with Dr Lyndon CodeFozia M Khan as a part of collaborative care agreement  Orders Placed This Encounter  Procedures  . Microscopic Examination  . UA/M w/rflx Culture, Routine    Meds ordered this encounter  Medications  . buPROPion (WELLBUTRIN) 100 MG tablet    Sig: Take 1 tablet (100 mg total) by mouth daily at 6 (six) AM.    Dispense:  30 tablet    Refill:  3    Order Specific Question:   Supervising Provider    Answer:   Lyndon CodeKHAN, FOZIA M [[id number]]    Time spent: 2330 Minutes      Lyndon CodeFozia M Khan, MD  Internal Medicine

## 2019-02-04 LAB — MICROSCOPIC EXAMINATION: Casts: NONE SEEN /lpf

## 2019-02-04 LAB — UA/M W/RFLX CULTURE, ROUTINE
Bilirubin, UA: NEGATIVE
Glucose, UA: NEGATIVE
Ketones, UA: NEGATIVE
Leukocytes,UA: NEGATIVE
Nitrite, UA: NEGATIVE
Protein,UA: NEGATIVE
RBC, UA: NEGATIVE
Specific Gravity, UA: 1.009 (ref 1.005–1.030)
Urobilinogen, Ur: 0.2 mg/dL (ref 0.2–1.0)
pH, UA: 5 (ref 5.0–7.5)

## 2019-02-12 LAB — PAP IG AND HPV HIGH-RISK: HPV, high-risk: NEGATIVE

## 2019-02-19 DIAGNOSIS — R5383 Other fatigue: Secondary | ICD-10-CM | POA: Insufficient documentation

## 2019-02-19 DIAGNOSIS — F339 Major depressive disorder, recurrent, unspecified: Secondary | ICD-10-CM | POA: Insufficient documentation

## 2019-02-19 DIAGNOSIS — Z124 Encounter for screening for malignant neoplasm of cervix: Secondary | ICD-10-CM | POA: Insufficient documentation

## 2019-02-21 ENCOUNTER — Ambulatory Visit: Payer: Self-pay | Admitting: Nurse Practitioner

## 2019-02-23 NOTE — Progress Notes (Signed)
Negative pap

## 2019-02-26 ENCOUNTER — Other Ambulatory Visit: Payer: Self-pay | Admitting: Nurse Practitioner

## 2019-02-26 DIAGNOSIS — F339 Major depressive disorder, recurrent, unspecified: Secondary | ICD-10-CM

## 2019-03-04 ENCOUNTER — Other Ambulatory Visit: Payer: Self-pay

## 2019-03-04 ENCOUNTER — Encounter: Payer: Self-pay | Admitting: Nurse Practitioner

## 2019-03-04 ENCOUNTER — Ambulatory Visit: Payer: Commercial Managed Care - PPO | Admitting: Nurse Practitioner

## 2019-03-04 VITALS — BP 134/81 | HR 96 | Resp 16 | Ht 59.0 in | Wt 190.2 lb

## 2019-03-04 DIAGNOSIS — E668 Other obesity: Secondary | ICD-10-CM

## 2019-03-04 DIAGNOSIS — F339 Major depressive disorder, recurrent, unspecified: Secondary | ICD-10-CM | POA: Diagnosis not present

## 2019-03-04 DIAGNOSIS — R5383 Other fatigue: Secondary | ICD-10-CM | POA: Diagnosis not present

## 2019-03-04 MED ORDER — PAROXETINE HCL 10 MG PO TABS: 10.0000 mg | ORAL_TABLET | Freq: Every day | ORAL | 3 refills | Status: DC

## 2019-03-04 MED ORDER — PHENTERMINE HCL 37.5 MG PO TABS: 37.5000 mg | ORAL_TABLET | Freq: Every day | ORAL | 1 refills | Status: DC

## 2019-03-04 NOTE — Progress Notes (Signed)
[facility] [place], [place] [address]  Internal MEDICINE  Office Visit Note  Patient Name: Rebecca Ponce  [id number]  [id number]  Date of Service: 03/06/2019  Chief Complaint  Patient presents with  . Follow-up    consider phentermine    The patient is here for follow up depression. Was started on low dose of wellbutrin at her last visit. She states that she felt like this medication caused her to be more emotional and irritable than without it. She has continued to take it until this visit.       Current Medication: Outpatient Encounter Medications as of 03/04/2019  Medication Sig  . buPROPion (WELLBUTRIN) 100 MG tablet TAKE 1 TABLET (100 MG TOTAL) BY MOUTH DAILY AT 6 (SIX) AM.  . PARoxetine (PAXIL) 10 MG tablet Take 1 tablet (10 mg total) by mouth daily.  . phentermine (ADIPEX-P) 37.5 MG tablet Take 1 tablet (37.5 mg total) by mouth daily before breakfast.   No facility-administered encounter medications on file as of 03/04/2019.     Surgical History: Past Surgical History:  Procedure Laterality Date  . CESAREAN SECTION     twice  . CESAREAN SECTION    . CESAREAN SECTION WITH BILATERAL TUBAL LIGATION N/A 06/11/2015   Procedure: CESAREAN SECTION WITH BILATERAL TUBAL LIGATION;  Surgeon: Malachy Mood, MD;  Location: [facility] ORS;  Service: Obstetrics;  Laterality: N/A;  . CHOLECYSTECTOMY    . TONSILLECTOMY    . TONSILLECTOMY      Medical History: Past Medical History:  Diagnosis Date  . Anemia   . Depression   . Gestational diabetes   . Hyperlipidemia   . Insomnia     Family History: Family History  Problem Relation Age of Onset  . Anxiety disorder Mother   . Diabetes Mother   . Hypertension Father   . Diabetes Maternal Aunt     Social History   Socioeconomic History  . Marital status: Married    Spouse name: Not on file  . Number of children: Not on file  . Years of education: Not on file  . Highest education level: Not on  file  Occupational History  . Not on file  Social Needs  . Financial resource strain: Not on file  . Food insecurity    Worry: Not on file    Inability: Not on file  . Transportation needs    Medical: Not on file    Non-medical: Not on file  Tobacco Use  . Smoking status: Never Smoker  . Smokeless tobacco: Never Used  . Tobacco comment: years ago  Substance and Sexual Activity  . Alcohol use: Yes    Comment: socially   . Drug use: Never  . Sexual activity: Not on file  Lifestyle  . Physical activity    Days per week: Not on file    Minutes per session: Not on file  . Stress: Not on file  Relationships  . Social Herbalist on phone: Not on file    Gets together: Not on file    Attends religious service: Not on file    Active member of club or organization: Not on file    Attends meetings of clubs or organizations: Not on file    Relationship status: Not on file  . Intimate partner violence    Fear of current or ex partner: Not on file    Emotionally abused: Not on file    Physically abused: Not on file  Forced sexual activity: Not on file  Other Topics Concern  . Not on file  Social History Narrative   ** Merged History Encounter **          Review of Systems  Constitutional: Positive for fatigue. Negative for chills and unexpected weight change.       Patient states that she would like to restart medication tto help with weight management.   HENT: Negative for congestion, postnasal drip, rhinorrhea, sneezing and sore throat.   Respiratory: Negative for cough, chest tightness, shortness of breath and wheezing.   Cardiovascular: Negative for chest pain and palpitations.  Gastrointestinal: Negative for abdominal pain, constipation, diarrhea, nausea and vomiting.  Endocrine: Negative for cold intolerance, heat intolerance, polydipsia and polyuria.  Musculoskeletal: Negative for arthralgias, back pain, joint swelling and neck pain.  Skin: Negative for  rash.  Neurological: Negative for dizziness, tremors, numbness and headaches.  Hematological: Negative for adenopathy. Does not bruise/bleed easily.  Psychiatric/Behavioral: Positive for dysphoric mood. Negative for behavioral problems (Depression), sleep disturbance and suicidal ideas. The patient is nervous/anxious.    Today's Vitals   03/04/19 1424 03/04/19 1443  BP: 134/81   Pulse: (!) 106 96  Resp: 16   SpO2: 97% 98%  Weight: 190 lb 3.2 oz (86.3 kg)   Height: 4\' 11"  (1.499 m)    Body mass index is 38.42 kg/m.  Physical Exam Vitals signs and nursing note reviewed.  Constitutional:      General: She is not in acute distress.    Appearance: Normal appearance. She is well-developed. She is not diaphoretic.  HENT:     Head: Normocephalic and atraumatic.     Mouth/Throat:     Pharynx: No oropharyngeal exudate.  Eyes:     Pupils: Pupils are equal, round, and reactive to light.  Neck:     Musculoskeletal: Normal range of motion and neck supple.     Thyroid: No thyromegaly.     Vascular: No JVD.     Trachea: No tracheal deviation.  Cardiovascular:     Rate and Rhythm: Normal rate and regular rhythm.     Heart sounds: Normal heart sounds. No murmur. No friction rub. No gallop.   Pulmonary:     Effort: Pulmonary effort is normal. No respiratory distress.     Breath sounds: Normal breath sounds. No wheezing or rales.  Chest:     Chest wall: No tenderness.  Abdominal:     Palpations: Abdomen is soft.     Tenderness: There is no guarding.  Musculoskeletal: Normal range of motion.     Comments:    Lymphadenopathy:     Cervical: No cervical adenopathy.  Skin:    General: Skin is warm and dry.  Neurological:     General: No focal deficit present.     Mental Status: She is alert and oriented to person, place, and time.     Cranial Nerves: No cranial nerve deficit.  Psychiatric:        Attention and Perception: Attention and perception normal.        Mood and Affect: Mood is  anxious and depressed.        Speech: Speech normal.        Behavior: Behavior normal. Behavior is cooperative.        Thought Content: Thought content normal.        Cognition and Memory: Cognition and memory normal.        Judgment: Judgment normal.   Assessment/Plan:  1. Other  fatigue Likely due to increase depression. Will continue to monitor.  2. Depression, recurrent (HCC) Stop wellbutrin. Start paxil 10mg  daily. Reassess at next visit.  - PARoxetine (PAXIL) 10 MG tablet; Take 1 tablet (10 mg total) by mouth daily.  Dispense: 30 tablet; Refill: 3  3. Moderate obesity Restart phentermine 37.5mg  tablets daily. Limit calorie intake to 1200-1500 calories per day. Incorporate exercise into daily routine.  - phentermine (ADIPEX-P) 37.5 MG tablet; Take 1 tablet (37.5 mg total) by mouth daily before breakfast.  Dispense: 30 tablet; Refill: 1  General Counseling: Elizet verbalizes understanding of the findings of todays visit and agrees with plan of treatment. I have discussed any further diagnostic evaluation that may be needed or ordered today. We also reviewed her medications today. she has been encouraged to call the office with any questions or concerns that should arise related to todays visit.   There is a liability release in patients' chart. There has been a 10 minute discussion about the side effects including but not limited to elevated blood pressure, anxiety, lack of sleep and dry mouth. Pt understands and will like to start/continue on appetite suppressant at this time. There will be one month RX given at the time of visit with proper follow up. Nova diet plan with restricted calories is given to the pt. Pt understands and agrees with  plan of treatment  This patient was seen by Vincent GrosHeather Kellsie Grindle FNP Collaboration with Dr Lyndon CodeFozia M Khan as a part of collaborative care agreement  Meds ordered this encounter  Medications  . PARoxetine (PAXIL) 10 MG tablet    Sig: Take 1 tablet (10  mg total) by mouth daily.    Dispense:  30 tablet    Refill:  3    Order Specific Question:   Supervising Provider    Answer:   Lyndon CodeKHAN, FOZIA M [[id number]]  . phentermine (ADIPEX-P) 37.5 MG tablet    Sig: Take 1 tablet (37.5 mg total) by mouth daily before breakfast.    Dispense:  30 tablet    Refill:  1    Order Specific Question:   Supervising Provider    Answer:   Lyndon CodeKHAN, FOZIA M [[id number]]    Time spent: 1025 Minutes      Dr Lyndon CodeFozia M Khan Internal medicine

## 2019-04-15 ENCOUNTER — Ambulatory Visit: Payer: Commercial Managed Care - PPO | Admitting: Nurse Practitioner

## 2019-04-18 ENCOUNTER — Ambulatory Visit: Payer: Self-pay | Admitting: Nurse Practitioner

## 2019-04-22 ENCOUNTER — Other Ambulatory Visit: Payer: Self-pay

## 2019-04-22 ENCOUNTER — Encounter: Payer: Self-pay | Admitting: Nurse Practitioner

## 2019-04-22 ENCOUNTER — Ambulatory Visit: Payer: Commercial Managed Care - PPO | Admitting: Nurse Practitioner

## 2019-04-22 VITALS — BP 136/80 | HR 104 | Temp 98.3°F | Resp 16 | Ht 59.0 in | Wt 192.6 lb

## 2019-04-22 DIAGNOSIS — R5383 Other fatigue: Secondary | ICD-10-CM | POA: Diagnosis not present

## 2019-04-22 DIAGNOSIS — F339 Major depressive disorder, recurrent, unspecified: Secondary | ICD-10-CM | POA: Diagnosis not present

## 2019-04-22 DIAGNOSIS — E668 Other obesity: Secondary | ICD-10-CM

## 2019-04-22 MED ORDER — PAROXETINE HCL 20 MG PO TABS: 20.0000 mg | ORAL_TABLET | Freq: Every day | ORAL | 2 refills | Status: DC

## 2019-04-22 MED ORDER — PHENTERMINE HCL 37.5 MG PO TABS: 37.5000 mg | ORAL_TABLET | Freq: Every day | ORAL | 1 refills | Status: DC

## 2019-04-22 NOTE — Progress Notes (Signed)
[facility] [address] [place], [place] [address]  Internal MEDICINE  Office Visit Note  Patient Name: Rebecca Ponce  [id number]  [id number]  Date of Service: 05/01/2019  Chief Complaint  Patient presents with  . Follow-up    started paroxetine (pt believes she needs a increase in dosage) and phentermine    Rebecca Ponce presents today for a medication follow-up. She is taking Paxil 10 mg daily and states that she has not experienced any negative side effects. However, she is still experiencing symptoms of generalized anxiety, social anxiety, and depression. She reports that her symptoms fluctuate, and currently rates her anxiety severity as 8/10 and her depression severity as 5/10. She denies any panic attacks, shortness of breath, or palpitations, but reports that she occasionally notices elevations in her heart rate on her Apple Watch. She also reports ongoing fatigue and high stress levels.      Current Medication: Outpatient Encounter Medications as of 04/22/2019  Medication Sig  . buPROPion (WELLBUTRIN) 100 MG tablet TAKE 1 TABLET (100 MG TOTAL) BY MOUTH DAILY AT 6 (SIX) AM.  . PARoxetine (PAXIL) 20 MG tablet Take 1 tablet (20 mg total) by mouth daily.  . phentermine (ADIPEX-P) 37.5 MG tablet Take 1 tablet (37.5 mg total) by mouth daily before breakfast.  . [DISCONTINUED] PARoxetine (PAXIL) 10 MG tablet Take 1 tablet (10 mg total) by mouth daily.  . [DISCONTINUED] phentermine (ADIPEX-P) 37.5 MG tablet Take 1 tablet (37.5 mg total) by mouth daily before breakfast.   No facility-administered encounter medications on file as of 04/22/2019.     Surgical History: Past Surgical History:  Procedure Laterality Date  . CESAREAN SECTION     twice  . CESAREAN SECTION    . CESAREAN SECTION WITH BILATERAL TUBAL LIGATION N/A 06/11/2015   Procedure: CESAREAN SECTION WITH BILATERAL TUBAL LIGATION;  Surgeon: Vena Austria, MD;  Location: [facility] ORS;  Service: Obstetrics;   Laterality: N/A;  . CHOLECYSTECTOMY    . TONSILLECTOMY    . TONSILLECTOMY      Medical History: Past Medical History:  Diagnosis Date  . Anemia   . Depression   . Gestational diabetes   . Hyperlipidemia   . Insomnia     Family History: Family History  Problem Relation Age of Onset  . Anxiety disorder Mother   . Diabetes Mother   . Hypertension Father   . Diabetes Maternal Aunt     Social History   Socioeconomic History  . Marital status: Married    Spouse name: Not on file  . Number of children: Not on file  . Years of education: Not on file  . Highest education level: Not on file  Occupational History  . Not on file  Social Needs  . Financial resource strain: Not on file  . Food insecurity    Worry: Not on file    Inability: Not on file  . Transportation needs    Medical: Not on file    Non-medical: Not on file  Tobacco Use  . Smoking status: Never Smoker  . Smokeless tobacco: Never Used  . Tobacco comment: years ago  Substance and Sexual Activity  . Alcohol use: Yes    Comment: socially   . Drug use: Never  . Sexual activity: Not on file  Lifestyle  . Physical activity    Days per week: Not on file    Minutes per session: Not on file  . Stress: Not on file  Relationships  . Social connections  Talks on phone: Not on file    Gets together: Not on file    Attends religious service: Not on file    Active member of club or organization: Not on file    Attends meetings of clubs or organizations: Not on file    Relationship status: Not on file  . Intimate partner violence    Fear of current or ex partner: Not on file    Emotionally abused: Not on file    Physically abused: Not on file    Forced sexual activity: Not on file  Other Topics Concern  . Not on file  Social History Narrative   ** Merged History Encounter **          Review of Systems  Constitutional: Positive for fatigue. Negative for activity change.       Two pound weight gain  since most recent visit  Respiratory: Negative for chest tightness, shortness of breath and wheezing.   Cardiovascular: Negative for chest pain and palpitations.  Gastrointestinal: Negative for constipation, diarrhea, nausea and vomiting.  Endocrine: Negative for cold intolerance, heat intolerance and polydipsia.  Allergic/Immunologic: Negative for environmental allergies.  Neurological: Negative for dizziness and headaches.  Psychiatric/Behavioral: Positive for dysphoric mood. The patient is nervous/anxious.     Today's Vitals   04/22/19 1344  BP: 136/80  Pulse: (!) 104  Resp: 16  Temp: 98.3 F (36.8 C)  SpO2: 99%  Weight: 192 lb 9.6 oz (87.4 kg)  Height: 4\' 11"  (1.499 m)   Body mass index is 38.9 kg/m.  Physical Exam Vitals signs and nursing note reviewed.  Constitutional:      Appearance: Normal appearance. She is obese.  HENT:     Nose: Nose normal.  Neck:     Musculoskeletal: Normal range of motion.  Cardiovascular:     Rate and Rhythm: Normal rate and regular rhythm.     Heart sounds: Normal heart sounds.  Pulmonary:     Effort: Pulmonary effort is normal.     Breath sounds: Normal breath sounds.  Abdominal:     Palpations: Abdomen is soft.  Neurological:     Mental Status: She is alert and oriented to person, place, and time.  Psychiatric:        Attention and Perception: Attention and perception normal.        Mood and Affect: Mood is anxious and depressed.        Speech: Speech normal.        Behavior: Behavior normal. Behavior is cooperative.        Thought Content: Thought content normal.        Cognition and Memory: Cognition and memory normal.     Assessment/Plan: 1. Other fatigue Gradually improved.   2. Depression, recurrent (HCC) Increase paxil to 10mg  daily. Reassess at next visit.  - PARoxetine (PAXIL) 20 MG tablet; Take 1 tablet (20 mg total) by mouth daily.  Dispense: 30 tablet; Refill: 2  3. Moderate obesity Restart phentermine daily.  Encouraged her to limit calorie intake to 1200-1500 calories per day and incorporate exercise into daily routine.  - phentermine (ADIPEX-P) 37.5 MG tablet; Take 1 tablet (37.5 mg total) by mouth daily before breakfast.  Dispense: 30 tablet; Refill: 1  General Counseling: Rebecca Ponce verbalizes understanding of the findings of todays visit and agrees with plan of treatment. I have discussed any further diagnostic evaluation that may be needed or ordered today. We also reviewed her medications today. she has been encouraged to call the  office with any questions or concerns that should arise related to todays visit.   There is a liability release in patients' chart. There has been a 10 minute discussion about the side effects including but not limited to elevated blood pressure, anxiety, lack of sleep and dry mouth. Pt understands and will like to start/continue on appetite suppressant at this time. There will be one month RX given at the time of visit with proper follow up. Nova diet plan with restricted calories is given to the pt. Pt understands and agrees with  plan of treatment  This patient was seen by Vincent GrosHeather Le Ferraz FNP Collaboration with Dr Lyndon CodeFozia M Khan as a part of collaborative care agreement  Meds ordered this encounter  Medications  . PARoxetine (PAXIL) 20 MG tablet    Sig: Take 1 tablet (20 mg total) by mouth daily.    Dispense:  30 tablet    Refill:  2    Please note increased dose    Order Specific Question:   Supervising Provider    Answer:   Lyndon CodeKHAN, FOZIA M [[id number]]  . phentermine (ADIPEX-P) 37.5 MG tablet    Sig: Take 1 tablet (37.5 mg total) by mouth daily before breakfast.    Dispense:  30 tablet    Refill:  1    Order Specific Question:   Supervising Provider    Answer:   Lyndon CodeKHAN, FOZIA M [[id number]]    Time spent: 5925 Minutes      Dr Lyndon CodeFozia M Khan Internal medicine

## 2019-05-13 ENCOUNTER — Other Ambulatory Visit: Payer: Self-pay

## 2019-05-13 MED ORDER — FLUCONAZOLE 150 MG PO TABS: ORAL_TABLET | ORAL | 0 refills | Status: DC

## 2019-05-13 NOTE — Telephone Encounter (Signed)
PT WALK IN CALLED YESTERDAY FOR REFILLS FOR YEAST INFECTIONS AS PER HEATHER SEND MED

## 2019-05-27 ENCOUNTER — Other Ambulatory Visit: Payer: Self-pay | Admitting: Nurse Practitioner

## 2019-05-27 DIAGNOSIS — B80 Enterobiasis: Secondary | ICD-10-CM

## 2019-05-27 MED ORDER — MEBENDAZOLE 100 MG PO CHEW: 100.0000 mg | CHEWABLE_TABLET | Freq: Once | ORAL | 0 refills | Status: AC

## 2019-05-27 NOTE — Progress Notes (Signed)
pt called and states that her daughter is being treated for pinworms and told her that the family needs to be treated. Sent in prescription for mebendazole 100mg  chewable tablet. This is one time dose which should be effective for pinworm infection. Sent to her pharmacy

## 2019-06-15 ENCOUNTER — Telehealth: Payer: Self-pay

## 2019-06-15 NOTE — Telephone Encounter (Signed)
Confirmed appointment with patient. klh °

## 2019-06-16 ENCOUNTER — Telehealth: Payer: Self-pay

## 2019-06-16 NOTE — Telephone Encounter (Signed)
Patient rescheduled appointment on 06/17/2019 to 07/22/2019. klh

## 2019-06-17 ENCOUNTER — Ambulatory Visit: Payer: Commercial Managed Care - PPO | Admitting: Nurse Practitioner

## 2019-06-18 ENCOUNTER — Other Ambulatory Visit: Payer: Self-pay | Admitting: Nurse Practitioner

## 2019-06-18 DIAGNOSIS — F339 Major depressive disorder, recurrent, unspecified: Secondary | ICD-10-CM

## 2019-07-21 ENCOUNTER — Telehealth: Payer: Self-pay

## 2019-07-21 ENCOUNTER — Other Ambulatory Visit: Payer: Self-pay

## 2019-07-21 DIAGNOSIS — F339 Major depressive disorder, recurrent, unspecified: Secondary | ICD-10-CM

## 2019-07-21 MED ORDER — PAROXETINE HCL 20 MG PO TABS: 20.0000 mg | ORAL_TABLET | Freq: Every day | ORAL | 0 refills | Status: DC

## 2019-07-21 NOTE — Telephone Encounter (Signed)
CONFIRMED AND SCREENED FOR 07-25-19 OV. 

## 2019-07-22 ENCOUNTER — Ambulatory Visit: Payer: Commercial Managed Care - PPO | Admitting: Nurse Practitioner

## 2019-07-25 ENCOUNTER — Encounter: Payer: Self-pay | Admitting: Nurse Practitioner

## 2019-07-25 ENCOUNTER — Encounter (INDEPENDENT_AMBULATORY_CARE_PROVIDER_SITE_OTHER): Payer: Self-pay

## 2019-07-25 ENCOUNTER — Other Ambulatory Visit: Payer: Self-pay | Admitting: Nurse Practitioner

## 2019-07-25 ENCOUNTER — Ambulatory Visit: Payer: Commercial Managed Care - PPO | Admitting: Nurse Practitioner

## 2019-07-25 ENCOUNTER — Other Ambulatory Visit: Payer: Self-pay

## 2019-07-25 VITALS — BP 130/86 | HR 100 | Resp 16 | Ht 59.0 in | Wt 192.0 lb

## 2019-07-25 DIAGNOSIS — R5383 Other fatigue: Secondary | ICD-10-CM | POA: Diagnosis not present

## 2019-07-25 DIAGNOSIS — Z6838 Body mass index (BMI) 38.0-38.9, adult: Secondary | ICD-10-CM

## 2019-07-25 DIAGNOSIS — F339 Major depressive disorder, recurrent, unspecified: Secondary | ICD-10-CM | POA: Diagnosis not present

## 2019-07-25 MED ORDER — PAROXETINE HCL 30 MG PO TABS: 30.0000 mg | ORAL_TABLET | Freq: Every day | ORAL | 3 refills | Status: DC

## 2019-07-25 MED ORDER — PHENDIMETRAZINE TARTRATE ER 105 MG PO CP24: 105.0000 mg | ORAL_CAPSULE | Freq: Every day | ORAL | 1 refills | Status: DC

## 2019-07-25 NOTE — Progress Notes (Signed)
[facility] [place], [place] [address]  Internal MEDICINE  Office Visit Note  Patient Name: Rebecca Ponce  [id number]  [id number]  Date of Service: 07/27/2019  Chief Complaint  Patient presents with  . Follow-up    increased paroxetine, has not helped  . Quality Metric Gaps    pnx vacc    The patient is here for routine follow up. Still taking phentermine to help with weight management.  Maintained her weight over the last two months. Does not feel as though it is helping her at all. Still having significant anxiety, mildly improved from most recent visit. Started on paxil 20mg  daily. Can be so severe that she does not want to leave the home at all. States that she is making herself leave the house at least once weekly for shopping.       Current Medication: Outpatient Encounter Medications as of 07/25/2019  Medication Sig Note  . fluconazole (DIFLUCAN) 150 MG tablet Take once. Repeat dose in 3 days if symptoms persist.   . PARoxetine (PAXIL) 30 MG tablet Take 1 tablet (30 mg total) by mouth daily.   . phentermine (ADIPEX-P) 37.5 MG tablet Take 1 tablet (37.5 mg total) by mouth daily before breakfast.   . [DISCONTINUED] buPROPion (WELLBUTRIN) 100 MG tablet TAKE 1 TABLET (100 MG TOTAL) BY MOUTH DAILY AT 6 (SIX) AM. 07/25/2019: negative side effects  . [DISCONTINUED] PARoxetine (PAXIL) 20 MG tablet Take 1 tablet (20 mg total) by mouth daily.   . [DISCONTINUED] Phendimetrazine Tartrate 105 MG CP24 Take 1 capsule (105 mg total) by mouth daily.    No facility-administered encounter medications on file as of 07/25/2019.    Surgical History: Past Surgical History:  Procedure Laterality Date  . CESAREAN SECTION     twice  . CESAREAN SECTION    . CESAREAN SECTION WITH BILATERAL TUBAL LIGATION N/A 06/11/2015   Procedure: CESAREAN SECTION WITH BILATERAL TUBAL LIGATION;  Surgeon: Malachy Mood, MD;  Location: [facility] ORS;  Service: Obstetrics;  Laterality: N/A;   . CHOLECYSTECTOMY    . TONSILLECTOMY    . TONSILLECTOMY      Medical History: Past Medical History:  Diagnosis Date  . Anemia   . Depression   . Gestational diabetes   . Hyperlipidemia   . Insomnia     Family History: Family History  Problem Relation Age of Onset  . Anxiety disorder Mother   . Diabetes Mother   . Hypertension Father   . Diabetes Maternal Aunt     Social History   Socioeconomic History  . Marital status: Married    Spouse name: Not on file  . Number of children: Not on file  . Years of education: Not on file  . Highest education level: Not on file  Occupational History  . Not on file  Tobacco Use  . Smoking status: Never Smoker  . Smokeless tobacco: Never Used  . Tobacco comment: years ago  Substance and Sexual Activity  . Alcohol use: Yes    Comment: socially   . Drug use: Never  . Sexual activity: Not on file  Other Topics Concern  . Not on file  Social History Narrative   ** Merged History Encounter **       Social Determinants of Health   Financial Resource Strain:   . Difficulty of Paying Living Expenses: Not on file  Food Insecurity:   . Worried About Charity fundraiser in the Last Year: Not on file  .  Ran Out of Food in the Last Year: Not on file  Transportation Needs:   . Lack of Transportation (Medical): Not on file  . Lack of Transportation (Non-Medical): Not on file  Physical Activity:   . Days of Exercise per Week: Not on file  . Minutes of Exercise per Session: Not on file  Stress:   . Feeling of Stress : Not on file  Social Connections:   . Frequency of Communication with Friends and Family: Not on file  . Frequency of Social Gatherings with Friends and Family: Not on file  . Attends Religious Services: Not on file  . Active Member of Clubs or Organizations: Not on file  . Attends Banker Meetings: Not on file  . Marital Status: Not on file  Intimate Partner Violence:   . Fear of Current or  Ex-Partner: Not on file  . Emotionally Abused: Not on file  . Physically Abused: Not on file  . Sexually Abused: Not on file      Review of Systems  Constitutional: Positive for fatigue. Negative for activity change.       Maintaining weight over last few visits.  Respiratory: Negative for chest tightness, shortness of breath and wheezing.   Cardiovascular: Negative for chest pain and palpitations.  Gastrointestinal: Negative for constipation, diarrhea, nausea and vomiting.  Endocrine: Negative for cold intolerance, heat intolerance, polydipsia and polyuria.  Allergic/Immunologic: Negative for environmental allergies.  Neurological: Negative for dizziness and headaches.  Psychiatric/Behavioral: Positive for dysphoric mood. The patient is nervous/anxious.        Anxiety getting worse recently. Panic attacks. Afraid to leave her home due to severity. Forces herself to go out at least once weekly for grocery shopping and appointments.     Today's Vitals   07/25/19 1132  BP: 130/86  Pulse: 100  Resp: 16  SpO2: 97%  Weight: 192 lb (87.1 kg)  Height: 4\' 11"  (1.499 m)   Body mass index is 38.78 kg/m.  Physical Exam Vitals and nursing note reviewed.  Constitutional:      Appearance: Normal appearance. She is obese.  HENT:     Nose: Nose normal.  Cardiovascular:     Rate and Rhythm: Normal rate and regular rhythm.     Heart sounds: Normal heart sounds.  Pulmonary:     Effort: Pulmonary effort is normal.     Breath sounds: Normal breath sounds.  Abdominal:     Palpations: Abdomen is soft.  Musculoskeletal:     Cervical back: Normal range of motion and neck supple.  Neurological:     Mental Status: She is alert and oriented to person, place, and time.  Psychiatric:        Attention and Perception: Attention and perception normal.        Mood and Affect: Mood is anxious and depressed.        Speech: Speech normal.        Behavior: Behavior normal. Behavior is cooperative.         Thought Content: Thought content normal.        Cognition and Memory: Cognition and memory normal.        Judgment: Judgment normal.    Assessment/Plan:  1. Other fatigue Likely due to depression. Will continue to monitor.   2. Depression, recurrent (HCC) Increased paxil to 30mg  tablet daily.  - PARoxetine (PAXIL) 30 MG tablet; Take 1 tablet (30 mg total) by mouth daily.  Dispense: 30 tablet; Refill: 3  3. BMI  38.0-38.9,adult Change appetite suppressant to Bontril SR 105mg  daily. Limit calorie intake to 1200-1500 calories per day and incorporate exercise into daily routine.   General Counseling: Rebecca Ponce verbalizes understanding of the findings of todays visit and agrees with plan of treatment. I have discussed any further diagnostic evaluation that may be needed or ordered today. We also reviewed her medications today. she has been encouraged to call the office with any questions or concerns that should arise related to todays visit.   There is a liability release in patients' chart. There has been a 10 minute discussion about the side effects including but not limited to elevated blood pressure, anxiety, lack of sleep and dry mouth. Pt understands and will like to start/continue on appetite suppressant at this time. There will be one month RX given at the time of visit with proper follow up. Nova diet plan with restricted calories is given to the pt. Pt understands and agrees with  plan of treatment  This patient was seen by Wilkie Aye FNP Collaboration with Dr Vincent Gros as a part of collaborative care agreement  Meds ordered this encounter  Medications  . DISCONTD: Phendimetrazine Tartrate 105 MG CP24    Sig: Take 1 capsule (105 mg total) by mouth daily.    Dispense:  30 capsule    Refill:  1    Order Specific Question:   Supervising Provider    Answer:   Lyndon Code [[id number]]  . PARoxetine (PAXIL) 30 MG tablet    Sig: Take 1 tablet (30 mg total) by mouth daily.     Dispense:  30 tablet    Refill:  3    Order Specific Question:   Supervising Provider    Answer:   Lyndon Code [[id number]]    Total Time spent: 30 Minutes  Time spent with patient included reviewing progress notes, labs, imaging studies, and discussing plan for follow up.       Dr Lyndon Code Internal medicine

## 2019-07-25 NOTE — Telephone Encounter (Signed)
Can you check this and change

## 2019-09-05 ENCOUNTER — Ambulatory Visit: Payer: Commercial Managed Care - PPO | Admitting: Nurse Practitioner

## 2019-09-16 ENCOUNTER — Telehealth: Payer: Self-pay

## 2019-09-16 NOTE — Telephone Encounter (Signed)
LMOM FOR PATIENT TO CONFIRM AND SCREEN FOR 09-20-19 OV. 

## 2019-09-20 ENCOUNTER — Ambulatory Visit: Payer: Commercial Managed Care - PPO | Admitting: Nurse Practitioner

## 2019-10-03 ENCOUNTER — Telehealth: Payer: Self-pay

## 2019-10-03 ENCOUNTER — Other Ambulatory Visit: Payer: Self-pay | Admitting: Nurse Practitioner

## 2019-10-03 DIAGNOSIS — R319 Hematuria, unspecified: Secondary | ICD-10-CM

## 2019-10-03 DIAGNOSIS — N390 Urinary tract infection, site not specified: Secondary | ICD-10-CM

## 2019-10-03 MED ORDER — CIPROFLOXACIN HCL 500 MG PO TABS: 500.0000 mg | ORAL_TABLET | Freq: Two times a day (BID) | ORAL | 0 refills | Status: DC

## 2019-10-03 NOTE — Telephone Encounter (Signed)
Sent cipro 500mg  twice daily for seven days for uti to [facility]. She should take ibuprofen for back pain. Consider trying OTC miconazole cream for vaginal itching and irritation.

## 2019-10-03 NOTE — Progress Notes (Signed)
Sent cipro 500mg  twice daily for seven days for uti to [facility].

## 2019-10-03 NOTE — Telephone Encounter (Signed)
Pt advised we send antibiotic keep follow up

## 2019-10-05 ENCOUNTER — Telehealth: Payer: Self-pay

## 2019-10-05 NOTE — Telephone Encounter (Signed)
LMOM FOR PATIENT TO CONIFRM AND SCREEN FOR 10-07-19 OV.

## 2019-10-07 ENCOUNTER — Other Ambulatory Visit: Payer: Self-pay

## 2019-10-07 ENCOUNTER — Emergency Department: Payer: Commercial Managed Care - PPO

## 2019-10-07 ENCOUNTER — Inpatient Hospital Stay
Admission: EM | Admit: 2019-10-07 | Discharge: 2019-10-10 | DRG: 871 | Disposition: A | Discharge status: Home / Self Care | Payer: Commercial Managed Care - PPO | Attending: Internal Medicine | Admitting: Internal Medicine

## 2019-10-07 ENCOUNTER — Ambulatory Visit: Payer: Commercial Managed Care - PPO | Admitting: Nurse Practitioner

## 2019-10-07 DIAGNOSIS — R079 Chest pain, unspecified: Secondary | ICD-10-CM

## 2019-10-07 DIAGNOSIS — Z818 Family history of other mental and behavioral disorders: Secondary | ICD-10-CM

## 2019-10-07 DIAGNOSIS — Z91040 Latex allergy status: Secondary | ICD-10-CM

## 2019-10-07 DIAGNOSIS — Z6837 Body mass index (BMI) 37.0-37.9, adult: Secondary | ICD-10-CM

## 2019-10-07 DIAGNOSIS — E119 Type 2 diabetes mellitus without complications: Secondary | ICD-10-CM | POA: Diagnosis present

## 2019-10-07 DIAGNOSIS — E860 Dehydration: Secondary | ICD-10-CM | POA: Diagnosis present

## 2019-10-07 DIAGNOSIS — E871 Hypo-osmolality and hyponatremia: Secondary | ICD-10-CM | POA: Diagnosis present

## 2019-10-07 DIAGNOSIS — E785 Hyperlipidemia, unspecified: Secondary | ICD-10-CM | POA: Diagnosis present

## 2019-10-07 DIAGNOSIS — J189 Pneumonia, unspecified organism: Secondary | ICD-10-CM | POA: Diagnosis present

## 2019-10-07 DIAGNOSIS — Z833 Family history of diabetes mellitus: Secondary | ICD-10-CM

## 2019-10-07 DIAGNOSIS — M545 Low back pain: Secondary | ICD-10-CM | POA: Diagnosis present

## 2019-10-07 DIAGNOSIS — Z20822 Contact with and (suspected) exposure to covid-19: Secondary | ICD-10-CM | POA: Diagnosis present

## 2019-10-07 DIAGNOSIS — Z79899 Other long term (current) drug therapy: Secondary | ICD-10-CM

## 2019-10-07 DIAGNOSIS — R739 Hyperglycemia, unspecified: Secondary | ICD-10-CM

## 2019-10-07 DIAGNOSIS — Z87440 Personal history of urinary (tract) infections: Secondary | ICD-10-CM | POA: Diagnosis present

## 2019-10-07 DIAGNOSIS — M5459 Other low back pain: Secondary | ICD-10-CM | POA: Diagnosis present

## 2019-10-07 DIAGNOSIS — Z880 Allergy status to penicillin: Secondary | ICD-10-CM

## 2019-10-07 DIAGNOSIS — Z8632 Personal history of gestational diabetes: Secondary | ICD-10-CM

## 2019-10-07 DIAGNOSIS — Z713 Dietary counseling and surveillance: Secondary | ICD-10-CM

## 2019-10-07 DIAGNOSIS — A419 Sepsis, unspecified organism: Principal | ICD-10-CM | POA: Diagnosis present

## 2019-10-07 DIAGNOSIS — J9601 Acute respiratory failure with hypoxia: Secondary | ICD-10-CM | POA: Diagnosis present

## 2019-10-07 DIAGNOSIS — F339 Major depressive disorder, recurrent, unspecified: Secondary | ICD-10-CM | POA: Diagnosis present

## 2019-10-07 DIAGNOSIS — G8929 Other chronic pain: Secondary | ICD-10-CM | POA: Diagnosis present

## 2019-10-07 DIAGNOSIS — R310 Gross hematuria: Secondary | ICD-10-CM | POA: Diagnosis not present

## 2019-10-07 DIAGNOSIS — I7300 Raynaud's syndrome without gangrene: Secondary | ICD-10-CM | POA: Diagnosis present

## 2019-10-07 LAB — CBC WITH DIFFERENTIAL/PLATELET
Abs Immature Granulocytes: 0.12 10*3/uL — ABNORMAL HIGH (ref 0.00–0.07)
Basophils Absolute: 0.1 10*3/uL (ref 0.0–0.1)
Basophils Relative: 0 %
Eosinophils Absolute: 0.4 10*3/uL (ref 0.0–0.5)
Eosinophils Relative: 2 %
HCT: 42.3 % (ref 36.0–46.0)
Hemoglobin: 14.3 g/dL (ref 12.0–15.0)
Immature Granulocytes: 1 %
Lymphocytes Relative: 9 %
Lymphs Abs: 1.7 10*3/uL (ref 0.7–4.0)
MCH: 31 pg (ref 26.0–34.0)
MCHC: 33.8 g/dL (ref 30.0–36.0)
MCV: 91.8 fL (ref 80.0–100.0)
Monocytes Absolute: 0.7 10*3/uL (ref 0.1–1.0)
Monocytes Relative: 4 %
Neutro Abs: 16.7 10*3/uL — ABNORMAL HIGH (ref 1.7–7.7)
Neutrophils Relative %: 84 %
Platelets: 183 10*3/uL (ref 150–400)
RBC: 4.61 MIL/uL (ref 3.87–5.11)
RDW: 12.7 % (ref 11.5–15.5)
WBC: 19.7 10*3/uL — ABNORMAL HIGH (ref 4.0–10.5)
nRBC: 0 % (ref 0.0–0.2)

## 2019-10-07 LAB — COMPREHENSIVE METABOLIC PANEL
ALT: 22 U/L (ref 0–44)
AST: 27 U/L (ref 15–41)
Albumin: 4 g/dL (ref 3.5–5.0)
Alkaline Phosphatase: 76 U/L (ref 38–126)
Anion gap: 14 (ref 5–15)
BUN: 10 mg/dL (ref 6–20)
CO2: 20 mmol/L — ABNORMAL LOW (ref 22–32)
Calcium: 8.9 mg/dL (ref 8.9–10.3)
Chloride: 98 mmol/L (ref 98–111)
Creatinine, Ser: 0.79 mg/dL (ref 0.44–1.00)
GFR calc Af Amer: >60 mL/min (ref >60)
GFR calc non Af Amer: >60 mL/min (ref >60)
Glucose, Bld: 299 mg/dL — ABNORMAL HIGH (ref 70–99)
Potassium: 3.8 mmol/L (ref 3.5–5.1)
Sodium: 132 mmol/L — ABNORMAL LOW (ref 135–145)
Total Bilirubin: 0.8 mg/dL (ref 0.3–1.2)
Total Protein: 7.5 g/dL (ref 6.5–8.1)

## 2019-10-07 LAB — RESPIRATORY PANEL BY RT PCR (FLU A&B, COVID)
Influenza A by PCR: NEGATIVE
Influenza B by PCR: NEGATIVE
SARS Coronavirus 2 by RT PCR: NEGATIVE

## 2019-10-07 LAB — POC SARS CORONAVIRUS 2 AG: SARS Coronavirus 2 Ag: NEGATIVE

## 2019-10-07 LAB — TROPONIN I (HIGH SENSITIVITY): Troponin I (High Sensitivity): 4 ng/L (ref <18)

## 2019-10-07 LAB — LACTIC ACID, PLASMA
Lactic Acid, Venous: 2 mmol/L (ref 0.5–1.9)
Lactic Acid, Venous: 2.3 mmol/L (ref 0.5–1.9)

## 2019-10-07 MED ORDER — SODIUM CHLORIDE 0.9 % IV BOLUS
1000.0000 mL | Freq: Once | INTRAVENOUS | Status: AC
  Administered 2019-10-07: 1000 mL via INTRAVENOUS

## 2019-10-07 MED ORDER — SODIUM CHLORIDE 0.9 % IV BOLUS
1000.0000 mL | Freq: Once | INTRAVENOUS | Status: AC
  Administered 2019-10-07: 22:00:00 1000 mL via INTRAVENOUS

## 2019-10-07 MED ORDER — ACETAMINOPHEN 500 MG PO TABS
1000.0000 mg | ORAL_TABLET | Freq: Once | ORAL | Status: AC
  Administered 2019-10-07: 1000 mg via ORAL
  Filled 2019-10-07: qty 2

## 2019-10-07 NOTE — ED Notes (Signed)
Pt ambulated in room, after ambulation to toilet without SOB, pt denies SOB att O2 sats started at 94% and lowest recorded 92% and highest 95%;  Pt asking "when can I go home"   EDP notified

## 2019-10-07 NOTE — ED Provider Notes (Signed)
[facility] Emergency Department Provider Note  ____________________________________________   I have reviewed the triage vital signs and the nursing notes.   HISTORY  Chief Complaint Chest Pain   History limited by: Not Limited   HPI Rebecca Ponce is a 29 y.o. female who presents to the emergency department today because of chest pain, shortness of breath, back pain, fevers.  Patient states his symptoms have been present for the past couple of days.  The patient has associated with the symptoms.  She states she has a history of urinary tract infections although has not noticed any dysuria or bad odor to her urine.  No known sick contacts.  Patient denies any underlying lung disease or smoking history.  Records reviewed. Per medical record review patient has a history of HLD, depression.  Past Medical History:  Diagnosis Date  . Anemia   . Depression   . Gestational diabetes   . Hyperlipidemia   . Insomnia     Patient Active Problem List   Diagnosis Date Noted  . Routine cervical smear 02/19/2019  . Other fatigue 02/19/2019  . Depression, recurrent ([place]) 02/19/2019  . Dysuria 07/02/2018  . Hematuria 05/30/2018  . Proteinuria 05/30/2018  . Carpal tunnel syndrome on right 05/30/2018  . Hormone imbalance 03/10/2018  . Gastroenteritis 01/15/2018  . Epigastric abdominal pain 01/15/2018  . Moderate obesity 12/14/2017  . Acne vulgaris 12/14/2017  . Raynaud's syndrome without gangrene 12/08/2017  . Major depressive disorder with single episode 12/08/2017  . Insomnia due to other mental disorder 12/08/2017  . H/O cesarean section 06/14/2015  . Labor and delivery, indication for care 06/12/2015  . Non-stress test nonreactive 06/01/2015    Past Surgical History:  Procedure Laterality Date  . CESAREAN SECTION     twice  . CESAREAN SECTION    . CESAREAN SECTION WITH BILATERAL TUBAL LIGATION N/A 06/11/2015   Procedure: CESAREAN SECTION WITH BILATERAL  TUBAL LIGATION;  Surgeon: Malachy Mood, MD;  Location: [facility] ORS;  Service: Obstetrics;  Laterality: N/A;  . CHOLECYSTECTOMY    . TONSILLECTOMY    . TONSILLECTOMY      Prior to Admission medications   Medication Sig Start Date End Date Taking? Authorizing Provider  ciprofloxacin (CIPRO) 500 MG tablet Take 1 tablet (500 mg total) by mouth 2 (two) times daily. 10/03/19   Ronnell Freshwater, NP  fluconazole (DIFLUCAN) 150 MG tablet Take once. Repeat dose in 3 days if symptoms persist. 05/13/19   Ronnell Freshwater, NP  PARoxetine (PAXIL) 30 MG tablet Take 1 tablet (30 mg total) by mouth daily. 07/25/19   Ronnell Freshwater, NP  Phendimetrazine Tartrate 35 MG TABS TAKE 1 TAB BY MOUTH THREE TIMES A DAY 07/25/19   Ronnell Freshwater, NP  phentermine (ADIPEX-P) 37.5 MG tablet Take 1 tablet (37.5 mg total) by mouth daily before breakfast. 04/22/19   Ronnell Freshwater, NP    Allergies Latex, Penicillins, Latex, and Penicillins  Family History  Problem Relation Age of Onset  . Anxiety disorder Mother   . Diabetes Mother   . Hypertension Father   . Diabetes Maternal Aunt     Social History Social History   Tobacco Use  . Smoking status: Never Smoker  . Smokeless tobacco: Never Used  . Tobacco comment: years ago  Substance Use Topics  . Alcohol use: Yes    Comment: socially   . Drug use: Never    Review of Systems Constitutional: Positive for fever.  Eyes: No visual changes.  ENT: No sore throat. Cardiovascular: Positive for chest pain. Respiratory: Positive shortness of breath. Gastrointestinal: No abdominal pain.  No nausea, no vomiting.  No diarrhea.   Genitourinary: Negative for dysuria. Musculoskeletal: Positive for back pain.  Skin: Negative for rash. Neurological: Negative for headaches, focal weakness or numbness.  ____________________________________________   PHYSICAL EXAM:  VITAL SIGNS: ED Triage Vitals  Enc Vitals Group     BP 10/07/19 2008 (!) 180/103     Pulse  Rate 10/07/19 2008 (!) 144     Resp 10/07/19 2008 (!) 22     Temp 10/07/19 2008 (!) 100.5 F (38.1 C)     Temp src --      SpO2 10/07/19 2008 95 %     Weight 10/07/19 2005 191 lb (86.6 kg)     Height 10/07/19 2005 4\' 11"  (1.499 m)     Head Circumference --      Peak Flow --      Pain Score 10/07/19 2004 9   Constitutional: Alert and oriented.  Eyes: Conjunctivae are normal.  ENT      Head: Normocephalic and atraumatic.      Nose: No congestion/rhinnorhea.      Mouth/Throat: Mucous membranes are moist.      Neck: No stridor. Hematological/Lymphatic/Immunilogical: No cervical lymphadenopathy. Cardiovascular: Tachycardic, regular rhythm.  No murmurs, rubs, or gallops.  Respiratory: Tachypnea. Diffuse expiratory wheezing Gastrointestinal: Soft and non tender. No rebound. No guarding.  Genitourinary: Deferred Musculoskeletal: Normal range of motion in all extremities. No lower extremity edema. Neurologic:  Normal speech and language. No gross focal neurologic deficits are appreciated.  Skin:  Skin is warm, dry and intact. No rash noted. Psychiatric: Mood and affect are normal. Speech and behavior are normal. Patient exhibits appropriate insight and judgment.  ____________________________________________    LABS (pertinent positives/negatives)  Trop hs 4 CBC wbc 19.7, hgb 14.3, plt 183 CMP na 132, co2 20, glu 299 Lactic acid 2.3  ____________________________________________   EKG  I, 2005, attending physician, personally viewed and interpreted this EKG  EKG Time: 2006 Rate: 129 Rhythm: sinus tachycardia Axis: normal Intervals: qtc 424 QRS: narrow ST changes: no st elevation Impression: abnormal ekg  ____________________________________________    RADIOLOGY  CXR Bilateral  airspace opacities  ____________________________________________   PROCEDURES  Procedures  ____________________________________________   INITIAL IMPRESSION / ASSESSMENT  AND PLAN / ED COURSE  Pertinent labs & imaging results that were available during my care of the patient were reviewed by me and considered in my medical decision making (see chart for details).   Patient presented to the emergency department today because of concerns for chest pain, shortness of breath, back pain and fevers.  Chest x-ray performed here was concerning for atypical viral pneumonia.  Given the Covid pandemic did have concerns for Covid.  Her Covid testing however was negative in the emergency department.  She was found to have a leukocytosis.  Given constellation of vital signs and leukocytosis with pneumonia will give IV antibiotics plan on admission.  Discussed this with the patient.  ____________________________________________   FINAL CLINICAL IMPRESSION(S) / ED DIAGNOSES  Final diagnoses:  Pneumonia due to infectious organism, unspecified laterality, unspecified part of lung     Note: This dictation was prepared with Dragon dictation. Any transcriptional errors that result from this process are unintentional     2007, MD 10/08/19 [phone]

## 2019-10-07 NOTE — ED Notes (Signed)
Xray with pt 

## 2019-10-07 NOTE — ED Notes (Signed)
Pt c/o sore throat, cough, CP, sob, and fatigue x2 days. Pt denies recent vaccinations and denies contact with anyone sick.  Pt is AOX4, appears dyspneic at rest.

## 2019-10-07 NOTE — ED Triage Notes (Signed)
Patient c/o left chest pain described as tight and tingly. Patient reports accompanying symptoms of SOB and weakness.

## 2019-10-08 DIAGNOSIS — F339 Major depressive disorder, recurrent, unspecified: Secondary | ICD-10-CM | POA: Diagnosis present

## 2019-10-08 DIAGNOSIS — Z20822 Contact with and (suspected) exposure to covid-19: Secondary | ICD-10-CM | POA: Diagnosis present

## 2019-10-08 DIAGNOSIS — E860 Dehydration: Secondary | ICD-10-CM | POA: Diagnosis present

## 2019-10-08 DIAGNOSIS — Z713 Dietary counseling and surveillance: Secondary | ICD-10-CM | POA: Diagnosis not present

## 2019-10-08 DIAGNOSIS — G8929 Other chronic pain: Secondary | ICD-10-CM | POA: Diagnosis present

## 2019-10-08 DIAGNOSIS — Z91040 Latex allergy status: Secondary | ICD-10-CM | POA: Diagnosis not present

## 2019-10-08 DIAGNOSIS — Z880 Allergy status to penicillin: Secondary | ICD-10-CM | POA: Diagnosis not present

## 2019-10-08 DIAGNOSIS — Z87440 Personal history of urinary (tract) infections: Secondary | ICD-10-CM | POA: Diagnosis not present

## 2019-10-08 DIAGNOSIS — E785 Hyperlipidemia, unspecified: Secondary | ICD-10-CM | POA: Diagnosis present

## 2019-10-08 DIAGNOSIS — M545 Low back pain: Secondary | ICD-10-CM

## 2019-10-08 DIAGNOSIS — A419 Sepsis, unspecified organism: Secondary | ICD-10-CM | POA: Diagnosis present

## 2019-10-08 DIAGNOSIS — R739 Hyperglycemia, unspecified: Secondary | ICD-10-CM | POA: Diagnosis not present

## 2019-10-08 DIAGNOSIS — I7300 Raynaud's syndrome without gangrene: Secondary | ICD-10-CM | POA: Diagnosis present

## 2019-10-08 DIAGNOSIS — E871 Hypo-osmolality and hyponatremia: Secondary | ICD-10-CM

## 2019-10-08 DIAGNOSIS — Z6837 Body mass index (BMI) 37.0-37.9, adult: Secondary | ICD-10-CM | POA: Diagnosis not present

## 2019-10-08 DIAGNOSIS — R310 Gross hematuria: Secondary | ICD-10-CM | POA: Diagnosis not present

## 2019-10-08 DIAGNOSIS — Z833 Family history of diabetes mellitus: Secondary | ICD-10-CM | POA: Diagnosis not present

## 2019-10-08 DIAGNOSIS — J189 Pneumonia, unspecified organism: Secondary | ICD-10-CM | POA: Diagnosis not present

## 2019-10-08 DIAGNOSIS — Z8632 Personal history of gestational diabetes: Secondary | ICD-10-CM | POA: Diagnosis not present

## 2019-10-08 DIAGNOSIS — J9601 Acute respiratory failure with hypoxia: Secondary | ICD-10-CM | POA: Diagnosis not present

## 2019-10-08 DIAGNOSIS — Z79899 Other long term (current) drug therapy: Secondary | ICD-10-CM | POA: Diagnosis not present

## 2019-10-08 DIAGNOSIS — Z818 Family history of other mental and behavioral disorders: Secondary | ICD-10-CM | POA: Diagnosis not present

## 2019-10-08 DIAGNOSIS — E119 Type 2 diabetes mellitus without complications: Secondary | ICD-10-CM | POA: Diagnosis present

## 2019-10-08 LAB — COMPREHENSIVE METABOLIC PANEL
ALT: 19 U/L (ref 0–44)
AST: 19 U/L (ref 15–41)
Albumin: 3.5 g/dL (ref 3.5–5.0)
Alkaline Phosphatase: 93 U/L (ref 38–126)
Anion gap: 9 (ref 5–15)
BUN: 7 mg/dL (ref 6–20)
CO2: 22 mmol/L (ref 22–32)
Calcium: 8.3 mg/dL — ABNORMAL LOW (ref 8.9–10.3)
Chloride: 106 mmol/L (ref 98–111)
Creatinine, Ser: 0.56 mg/dL (ref 0.44–1.00)
GFR calc Af Amer: >60 mL/min (ref >60)
GFR calc non Af Amer: >60 mL/min (ref >60)
Glucose, Bld: 212 mg/dL — ABNORMAL HIGH (ref 70–99)
Potassium: 3.6 mmol/L (ref 3.5–5.1)
Sodium: 137 mmol/L (ref 135–145)
Total Bilirubin: 1.2 mg/dL (ref 0.3–1.2)
Total Protein: 6.7 g/dL (ref 6.5–8.1)

## 2019-10-08 LAB — CBC WITH DIFFERENTIAL/PLATELET
Abs Immature Granulocytes: 0.11 10*3/uL — ABNORMAL HIGH (ref 0.00–0.07)
Basophils Absolute: 0.1 10*3/uL (ref 0.0–0.1)
Basophils Relative: 0 %
Eosinophils Absolute: 0.3 10*3/uL (ref 0.0–0.5)
Eosinophils Relative: 2 %
HCT: 38.1 % (ref 36.0–46.0)
Hemoglobin: 12.8 g/dL (ref 12.0–15.0)
Immature Granulocytes: 1 %
Lymphocytes Relative: 13 %
Lymphs Abs: 2 10*3/uL (ref 0.7–4.0)
MCH: 31.3 pg (ref 26.0–34.0)
MCHC: 33.6 g/dL (ref 30.0–36.0)
MCV: 93.2 fL (ref 80.0–100.0)
Monocytes Absolute: 0.6 10*3/uL (ref 0.1–1.0)
Monocytes Relative: 4 %
Neutro Abs: 12 10*3/uL — ABNORMAL HIGH (ref 1.7–7.7)
Neutrophils Relative %: 80 %
Platelets: 165 10*3/uL (ref 150–400)
RBC: 4.09 MIL/uL (ref 3.87–5.11)
RDW: 12.9 % (ref 11.5–15.5)
WBC: 15.1 10*3/uL — ABNORMAL HIGH (ref 4.0–10.5)
nRBC: 0 % (ref 0.0–0.2)

## 2019-10-08 LAB — URINALYSIS, COMPLETE (UACMP) WITH MICROSCOPIC
Bacteria, UA: NONE SEEN
Bilirubin Urine: NEGATIVE
Glucose, UA: >=500 mg/dL — AB
Ketones, ur: 5 mg/dL — AB
Leukocytes,Ua: NEGATIVE
Nitrite: NEGATIVE
Protein, ur: NEGATIVE mg/dL
Specific Gravity, Urine: 1.03 (ref 1.005–1.030)
pH: 5 (ref 5.0–8.0)

## 2019-10-08 LAB — HIV ANTIBODY (ROUTINE TESTING W REFLEX): HIV Screen 4th Generation wRfx: NONREACTIVE

## 2019-10-08 LAB — TROPONIN I (HIGH SENSITIVITY): Troponin I (High Sensitivity): 3 ng/L (ref <18)

## 2019-10-08 LAB — LACTIC ACID, PLASMA
Lactic Acid, Venous: 2 mmol/L (ref 0.5–1.9)
Lactic Acid, Venous: 2.5 mmol/L (ref 0.5–1.9)

## 2019-10-08 LAB — PREGNANCY, URINE: Preg Test, Ur: NEGATIVE

## 2019-10-08 LAB — GLUCOSE, CAPILLARY: Glucose-Capillary: 230 mg/dL — ABNORMAL HIGH (ref 70–99)

## 2019-10-08 LAB — PROCALCITONIN: Procalcitonin: <0.1 ng/mL

## 2019-10-08 LAB — POCT PREGNANCY, URINE: Preg Test, Ur: NEGATIVE

## 2019-10-08 MED ORDER — HYDROCODONE-ACETAMINOPHEN 5-325 MG PO TABS
1.0000 | ORAL_TABLET | Freq: Four times a day (QID) | ORAL | Status: DC | PRN
  Administered 2019-10-08 – 2019-10-09 (×2): 1 via ORAL
  Filled 2019-10-08 (×2): qty 1

## 2019-10-08 MED ORDER — SODIUM CHLORIDE 0.9 % IV SOLN
500.0000 mg | INTRAVENOUS | Status: DC
  Administered 2019-10-09 – 2019-10-10 (×2): 500 mg via INTRAVENOUS
  Filled 2019-10-08 (×4): qty 500

## 2019-10-08 MED ORDER — SODIUM CHLORIDE 0.9 % IV SOLN
500.0000 mg | Freq: Once | INTRAVENOUS | Status: AC
  Administered 2019-10-08: 02:00:00 500 mg via INTRAVENOUS
  Filled 2019-10-08: qty 500

## 2019-10-08 MED ORDER — ALBUTEROL SULFATE HFA 108 (90 BASE) MCG/ACT IN AERS
1.0000 | INHALATION_SPRAY | RESPIRATORY_TRACT | Status: DC | PRN
  Filled 2019-10-08: qty 6.7

## 2019-10-08 MED ORDER — METFORMIN HCL 500 MG PO TABS: 500.0000 mg | ORAL_TABLET | Freq: Two times a day (BID) | ORAL | Status: DC

## 2019-10-08 MED ORDER — ACETAMINOPHEN 325 MG PO TABS
650.0000 mg | ORAL_TABLET | Freq: Four times a day (QID) | ORAL | Status: DC | PRN
  Administered 2019-10-10: 650 mg via ORAL
  Filled 2019-10-08: qty 2

## 2019-10-08 MED ORDER — METFORMIN HCL 500 MG PO TABS
500.0000 mg | ORAL_TABLET | Freq: Two times a day (BID) | ORAL | Status: DC
  Filled 2019-10-08: qty 1

## 2019-10-08 MED ORDER — SODIUM CHLORIDE 0.9 % IV SOLN
1.0000 g | INTRAVENOUS | Status: DC
  Administered 2019-10-09 – 2019-10-10 (×2): 1 g via INTRAVENOUS
  Filled 2019-10-08: qty 1
  Filled 2019-10-08 (×3): qty 10

## 2019-10-08 MED ORDER — TRAMADOL HCL 50 MG PO TABS
50.0000 mg | ORAL_TABLET | Freq: Four times a day (QID) | ORAL | Status: DC | PRN
  Administered 2019-10-08 – 2019-10-09 (×2): 50 mg via ORAL
  Filled 2019-10-08 (×2): qty 1

## 2019-10-08 MED ORDER — SODIUM CHLORIDE 0.9 % IV SOLN
1.0000 g | Freq: Once | INTRAVENOUS | Status: AC
  Administered 2019-10-08: 02:00:00 1 g via INTRAVENOUS
  Filled 2019-10-08: qty 10

## 2019-10-08 MED ORDER — MORPHINE SULFATE (PF) 2 MG/ML IV SOLN
2.0000 mg | INTRAVENOUS | Status: DC | PRN
  Administered 2019-10-08: 2 mg via INTRAVENOUS
  Filled 2019-10-08: qty 1

## 2019-10-08 MED ORDER — ALBUTEROL SULFATE (2.5 MG/3ML) 0.083% IN NEBU: 2.5000 mg | INHALATION_SOLUTION | RESPIRATORY_TRACT | Status: DC | PRN

## 2019-10-08 MED ORDER — IPRATROPIUM-ALBUTEROL 0.5-2.5 (3) MG/3ML IN SOLN
3.0000 mL | Freq: Four times a day (QID) | RESPIRATORY_TRACT | Status: DC
  Administered 2019-10-08 – 2019-10-09 (×5): 3 mL via RESPIRATORY_TRACT
  Filled 2019-10-08 (×6): qty 3

## 2019-10-08 MED ORDER — SODIUM CHLORIDE 0.9 % IV SOLN: INTRAVENOUS | Status: DC

## 2019-10-08 MED ORDER — ENOXAPARIN SODIUM 40 MG/0.4ML ~~LOC~~ SOLN
40.0000 mg | SUBCUTANEOUS | Status: DC
  Administered 2019-10-08 – 2019-10-10 (×3): 40 mg via SUBCUTANEOUS
  Filled 2019-10-08 (×3): qty 0.4

## 2019-10-08 MED ORDER — MORPHINE SULFATE (PF) 2 MG/ML IV SOLN
2.0000 mg | INTRAVENOUS | Status: DC | PRN
  Administered 2019-10-08 (×2): 2 mg via INTRAVENOUS
  Filled 2019-10-08 (×2): qty 1

## 2019-10-08 NOTE — H&P (Signed)
History and Physical   Rebecca Ponce DPO:[id number] DOB: December 27, 1990 DOA: 10/07/2019  Referring MD/NP/PA: Dr. Archie Balboa  PCP: Ronnell Freshwater, NP   Outpatient Specialists: None  Patient coming from: Home  Chief Complaint: Shortness of breath  HPI: Rebecca Ponce is a 30 y.o. female with medical history significant of major depressive disorder, gestational diabetes, morbid obesity, hyperlipidemia, organic insomnia who presents to the ER with progressive shortness of breath fever and cough.  This is associated with left-sided chest pain with weakness.  Patient initially thought to have COVID-19 but test was negative.  She was recently treated for UTI with Cipro from her primary care physician about 5 days ago.  She is also complaining of back pain rated as 8 out of 10.  Denied any fall.  Denied any exposure to COVID-19 or any other infectious conditions.  She usually has recurring dysuria and UTIs for which she is getting treated.  Patient is x-ray showed bilateral infiltrates.  She is being treated for community-acquired pneumonia.  She denied any sputum production.  She has fever with chills..  ED Course: Temperature is 101 blood pressure 180/103 pulse 144 respirate 26 oxygen sat 94% on room air. Lactic acid 2.3, WBC 19.7, rest of CBC and CMP are within normal. CXR shows Vague opacities in both lungs and left midlung zone consistent with atypical pneumonia.  Review of Systems: As per HPI otherwise 10 point review of systems negative.    Past Medical History:  Diagnosis Date  . Anemia   . Depression   . Gestational diabetes   . Hyperlipidemia   . Insomnia     Past Surgical History:  Procedure Laterality Date  . CESAREAN SECTION     twice  . CESAREAN SECTION    . CESAREAN SECTION WITH BILATERAL TUBAL LIGATION N/A 06/11/2015   Procedure: CESAREAN SECTION WITH BILATERAL TUBAL LIGATION;  Surgeon: Malachy Mood, MD;  Location: [facility] ORS;  Service: Obstetrics;  Laterality: N/A;  .  CHOLECYSTECTOMY    . TONSILLECTOMY    . TONSILLECTOMY       reports that she has never smoked. She has never used smokeless tobacco. She reports current alcohol use. She reports that she does not use drugs.  Allergies  Allergen Reactions  . Latex Rash    States positive allergy test for latex  . Penicillins Rash and Other (See Comments)    Fever  . Latex   . Penicillins     Family History  Problem Relation Age of Onset  . Anxiety disorder Mother   . Diabetes Mother   . Hypertension Father   . Diabetes Maternal Aunt      Prior to Admission medications   Medication Sig Start Date End Date Taking? Authorizing Provider  ciprofloxacin (CIPRO) 500 MG tablet Take 1 tablet (500 mg total) by mouth 2 (two) times daily. 10/03/19   Ronnell Freshwater, NP  fluconazole (DIFLUCAN) 150 MG tablet Take once. Repeat dose in 3 days if symptoms persist. 05/13/19   Ronnell Freshwater, NP  PARoxetine (PAXIL) 30 MG tablet Take 1 tablet (30 mg total) by mouth daily. 07/25/19   Ronnell Freshwater, NP  Phendimetrazine Tartrate 35 MG TABS TAKE 1 TAB BY MOUTH THREE TIMES A DAY 07/25/19   Ronnell Freshwater, NP  phentermine (ADIPEX-P) 37.5 MG tablet Take 1 tablet (37.5 mg total) by mouth daily before breakfast. 04/22/19   Ronnell Freshwater, NP    Physical Exam: Vitals:   10/07/19 2154 10/07/19 2214 10/07/19 2300  10/07/19 2337  BP:    (!) 152/81  Pulse: (!) 129 (!) 115 (!) 127 (!) 123  Resp: 19  (!) 26 (!) 25  Temp:    (!) 101 F (38.3 C)  TempSrc:    Oral  SpO2: 95% 94% 95% 94%  Weight:      Height:          Constitutional: Morbidly obese,In mild respiratory distress Vitals:   10/07/19 2154 10/07/19 2214 10/07/19 2300 10/07/19 2337  BP:    (!) 152/81  Pulse: (!) 129 (!) 115 (!) 127 (!) 123  Resp: 19  (!) 26 (!) 25  Temp:    (!) 101 F (38.3 C)  TempSrc:    Oral  SpO2: 95% 94% 95% 94%  Weight:      Height:       Eyes: PERRL, lids and conjunctivae normal ENMT: Mucous membranes are dry.  Posterior pharynx clear of any exudate or lesions.Normal dentition.  Neck: normal, supple, no masses, no thyromegaly Respiratory: Decreased air entry bilaterally, coarse breath sound, no wheeze, no crackles, some rhonchi normal respiratory effort. No accessory muscle use.  Cardiovascular: Sinus tachycardia, no murmurs / rubs / gallops. No extremity edema. 2+ pedal pulses. No carotid bruits.  Abdomen: no tenderness, no masses palpated. No hepatosplenomegaly. Bowel sounds positive.  Musculoskeletal: no clubbing / cyanosis. No joint deformity upper and lower extremities. Good ROM, no contractures. Normal muscle tone.  Skin: no rashes, lesions, ulcers. No induration Neurologic: CN 2-12 grossly intact. Sensation intact, DTR normal. Strength 5/5 in all 4.  Psychiatric: Normal judgment and insight. Alert and oriented x 3. Normal mood.     Labs on Admission: I have personally reviewed following labs and imaging studies  CBC: Recent Labs  Lab 10/07/19 2011  WBC 19.7*  NEUTROABS 16.7*  HGB 14.3  HCT 42.3  MCV 91.8  PLT 183   Basic Metabolic Panel: Recent Labs  Lab 10/07/19 2011  NA 132*  K 3.8  CL 98  CO2 20*  GLUCOSE 299*  BUN 10  CREATININE 0.79  CALCIUM 8.9   GFR: Estimated Creatinine Clearance: 98.4 mL/min (by C-G formula based on SCr of 0.79 mg/dL). Liver Function Tests: Recent Labs  Lab 10/07/19 2011  AST 27  ALT 22  ALKPHOS 76  BILITOT 0.8  PROT 7.5  ALBUMIN 4.0   No results for input(s): LIPASE, AMYLASE in the last 168 hours. No results for input(s): AMMONIA in the last 168 hours. Coagulation Profile: No results for input(s): INR, PROTIME in the last 168 hours. Cardiac Enzymes: No results for input(s): CKTOTAL, CKMB, CKMBINDEX, TROPONINI in the last 168 hours. BNP (last 3 results) No results for input(s): PROBNP in the last 8760 hours. HbA1C: No results for input(s): HGBA1C in the last 72 hours. CBG: No results for input(s): GLUCAP in the last 168  hours. Lipid Profile: No results for input(s): CHOL, HDL, LDLCALC, TRIG, CHOLHDL, LDLDIRECT in the last 72 hours. Thyroid Function Tests: No results for input(s): TSH, T4TOTAL, FREET4, T3FREE, THYROIDAB in the last 72 hours. Anemia Panel: No results for input(s): VITAMINB12, FOLATE, FERRITIN, TIBC, IRON, RETICCTPCT in the last 72 hours. Urine analysis:    Component Value Date/Time   COLORURINE Yellow 03/17/2012 1306   COLORURINE YELLOW 10/06/2009 0225   APPEARANCEUR Clear 02/03/2019 1533   LABSPEC 1.027 03/17/2012 1306   PHURINE 6.0 03/17/2012 1306   PHURINE 6.0 10/06/2009 0225   GLUCOSEU Negative 02/03/2019 1533   GLUCOSEU Negative 03/17/2012 1306   HGBUR 1+  03/17/2012 1306   HGBUR TRACE (A) 10/06/2009 0225   BILIRUBINUR Negative 02/03/2019 1533   BILIRUBINUR Negative 03/17/2012 1306   KETONESUR Negative 03/17/2012 1306   KETONESUR NEGATIVE 10/06/2009 0225   PROTEINUR Negative 02/03/2019 1533   PROTEINUR Negative 03/17/2012 1306   PROTEINUR NEGATIVE 10/06/2009 0225   UROBILINOGEN 0.2 07/02/2018 1418   UROBILINOGEN 0.2 10/06/2009 0225   NITRITE Negative 02/03/2019 1533   NITRITE Negative 03/17/2012 1306   NITRITE NEGATIVE 10/06/2009 0225   LEUKOCYTESUR Negative 02/03/2019 1533   LEUKOCYTESUR Negative 03/17/2012 1306   Sepsis Labs: @LABRCNTIP (procalcitonin:4,lacticidven:4) ) Recent Results (from the past 240 hour(s))  Respiratory Panel by RT PCR (Flu A&B, Covid) - Nasopharyngeal Swab     Status: None   Collection Time: 10/07/19 10:05 PM   Specimen: Nasopharyngeal Swab  Result Value Ref Range Status   SARS Coronavirus 2 by RT PCR NEGATIVE NEGATIVE Final    Comment: (NOTE) SARS-CoV-2 target nucleic acids are NOT DETECTED. The SARS-CoV-2 RNA is generally detectable in upper respiratoy specimens during the acute phase of infection. The lowest concentration of SARS-CoV-2 viral copies this assay can detect is 131 copies/mL. A negative result does not preclude  SARS-Cov-2 infection and should not be used as the sole basis for treatment or other patient management decisions. A negative result may occur with  improper specimen collection/handling, submission of specimen other than nasopharyngeal swab, presence of viral mutation(s) within the areas targeted by this assay, and inadequate number of viral copies (<131 copies/mL). A negative result must be combined with clinical observations, patient history, and epidemiological information. The expected result is Negative. Fact Sheet for Patients:  10/09/19 Fact Sheet for Healthcare Providers:  https://www.moore.com/ This test is not yet ap proved or cleared by the https://www.young.biz/ FDA and  has been authorized for detection and/or diagnosis of SARS-CoV-2 by FDA under an Emergency Use Authorization (EUA). This EUA will remain  in effect (meaning this test can be used) for the duration of the COVID-19 declaration under Section 564(b)(1) of the Act, 21 U.S.C. section 360bbb-3(b)(1), unless the authorization is terminated or revoked sooner.    Influenza A by PCR NEGATIVE NEGATIVE Final   Influenza B by PCR NEGATIVE NEGATIVE Final    Comment: (NOTE) The Xpert Xpress SARS-CoV-2/FLU/RSV assay is intended as an aid in  the diagnosis of influenza from Nasopharyngeal swab specimens and  should not be used as a sole basis for treatment. Nasal washings and  aspirates are unacceptable for Xpert Xpress SARS-CoV-2/FLU/RSV  testing. Fact Sheet for Patients: [place] Fact Sheet for Healthcare Providers: https://www.moore.com/ This test is not yet approved or cleared by the https://www.young.biz/ FDA and  has been authorized for detection and/or diagnosis of SARS-CoV-2 by  FDA under an Emergency Use Authorization (EUA). This EUA will remain  in effect (meaning this test can be used) for the duration of the  Covid-19  declaration under Section 564(b)(1) of the Act, 21  U.S.C. section 360bbb-3(b)(1), unless the authorization is  terminated or revoked. Performed at [facility], [address], [place], [place]      Radiological Exams on Admission: DG Chest [facility] 1 View  Result Date: 10/07/2019 CLINICAL DATA:  Left-sided chest pain. Shortness of breath. Weakness. EXAM: PORTABLE CHEST 1 VIEW COMPARISON:  None. FINDINGS: Normal heart size and mediastinal contours. Vague opacities in both lung bases and left midlung zone. No pulmonary edema, large pleural effusion or pneumothorax. No acute osseous abnormalities are seen. IMPRESSION: Vague opacities in both lung bases and  left midlung zone, may represent atelectasis or atypical viral pneumonia, including COVID-19. Electronically Signed   By: Narda Rutherford M.D.   On: 10/07/2019 20:32    Assessment/Plan Principal Problem:   CAP (community acquired pneumonia) Active Problems:   Depression, recurrent (HCC)   Intractable low back pain   Hyponatremia     #1 community-acquired pneumonia: Patient will be admitted and initiated on IV Rocephin and Zithromax.  IV fluids and antibiotics.  #2 morbid obesity: Dietary counseling  #3 hyponatremia: Probably dehydration.  Continue treatment  #4 recurrent UTI: Recently treated with Cipro.  Get urinalysis.  Empirically on Rocephin for pneumonia  #5 recurrent depression: Confirm and resume home regimen.   DVT prophylaxis: Lovenox Code Status: Full code Family Communication: No family at bedside Disposition Plan: Home Consults called: None Admission status: Inpatient  Severity of Illness: The appropriate patient status for this patient is INPATIENT. Inpatient status is judged to be reasonable and necessary in order to provide the required intensity of service to ensure the patient's safety. The patient's presenting symptoms, physical exam findings, and initial radiographic and laboratory data  in the context of their chronic comorbidities is felt to place them at high risk for further clinical deterioration. Furthermore, it is not anticipated that the patient will be medically stable for discharge from the hospital within 2 midnights of admission. The following factors support the patient status of inpatient.   " The patient's presenting symptoms include shortness of breath and chest pain. " The worrisome physical exam findings include coarse breath. " The initial radiographic and laboratory data are worrisome because of bilateral infiltrates. " The chronic co-morbidities include depression.   * I certify that at the point of admission it is my clinical judgment that the patient will require inpatient hospital care spanning beyond 2 midnights from the point of admission due to high intensity of service, high risk for further deterioration and high frequency of surveillance required.Lonia Blood MD [facility] Pager [phone]  If 7PM-7AM, please contact night-coverage [email or web address] Password [facility]  10/08/2019, 12:20 AM

## 2019-10-08 NOTE — Progress Notes (Addendum)
  PROGRESS NOTE    Rebecca Ponce  RCB:[id number] DOB: 07-24-1990 DOA: 10/07/2019  PCP: Carlean Jews, NP    LOS - 0    Patient admitted overnight with sepsis secondary to atypical CAP, Covid-19 and flu negative.  Chest xray with bilateral opacities.  Being treated with Rocephin and Zithromax.  Initially febrile 100.5 F, HR 144, RR 22, BP 180/103, not hypoxic with o2 sat 95% on room air.  Interval subjective: Patient reports feeling slightly better since admission last night.  Reports she continues to have dry hacking cough but this is a little better since admission.  States she does have recurrent UTIs and never has symptoms.  Exam: Lungs with diffuse high-pitched expiratory wheezes and scattered rhonchi  I have reviewed the full H&P by Dr. Mikeal Hawthorne in detail, and I agree with the assessment and plan as outlined therein. In addition: --Add scheduled DuoNebs every 6 hour --Add albuterol inhaler every 3 hours as needed --Added pain medications: Tylenol for mild, tramadol for moderate, Norco for severe, morphine for breakthrough --Follow-up repeat lactic acid x 2 today --Continue IV fluids at current rate if lactate still elevated, if normal can stop fluids or reduce the rate --Check blood glucose twice daily (history of gestational diabetes and elevated blood glucose on labs) --Check A1c --Start Metformin 500 mg twice daily --Will add sliding scale NovoLog if indicated   No Charge    Pennie Banter, DO [facility]   If 7PM-7AM, please contact night-coverage [email or web address] 10/08/2019, 7:22 AM

## 2019-10-08 NOTE — Progress Notes (Signed)
Report called and given to [place], Charity fundraiser.

## 2019-10-08 NOTE — Progress Notes (Signed)
Patient CBG was 230. Dr. Denton Lank notified. Glycemic control order set to be ordered.

## 2019-10-08 NOTE — Plan of Care (Signed)
  Problem: Activity: Goal: Ability to tolerate increased activity will improve Outcome: Progressing   Problem: Clinical Measurements: Goal: Ability to maintain a body temperature in the normal range will improve Outcome: Progressing   Problem: Respiratory: Goal: Ability to maintain adequate ventilation will improve Outcome: Progressing Goal: Ability to maintain a clear airway will improve Outcome: Progressing   

## 2019-10-09 DIAGNOSIS — R739 Hyperglycemia, unspecified: Secondary | ICD-10-CM

## 2019-10-09 DIAGNOSIS — J9601 Acute respiratory failure with hypoxia: Secondary | ICD-10-CM | POA: Diagnosis present

## 2019-10-09 DIAGNOSIS — Z87440 Personal history of urinary (tract) infections: Secondary | ICD-10-CM | POA: Diagnosis present

## 2019-10-09 LAB — URINALYSIS, ROUTINE W REFLEX MICROSCOPIC
Bacteria, UA: NONE SEEN
Bilirubin Urine: NEGATIVE
Glucose, UA: >=500 mg/dL — AB
Ketones, ur: NEGATIVE mg/dL
Leukocytes,Ua: NEGATIVE
Nitrite: NEGATIVE
Protein, ur: NEGATIVE mg/dL
RBC / HPF: >50 RBC/hpf — ABNORMAL HIGH (ref 0–5)
Specific Gravity, Urine: 1.009 (ref 1.005–1.030)
pH: 9 — ABNORMAL HIGH (ref 5.0–8.0)

## 2019-10-09 LAB — CBC WITH DIFFERENTIAL/PLATELET
Abs Immature Granulocytes: 0.06 10*3/uL (ref 0.00–0.07)
Basophils Absolute: 0.1 10*3/uL (ref 0.0–0.1)
Basophils Relative: 0 %
Eosinophils Absolute: 0.5 10*3/uL (ref 0.0–0.5)
Eosinophils Relative: 5 %
HCT: 36 % (ref 36.0–46.0)
Hemoglobin: 11.9 g/dL — ABNORMAL LOW (ref 12.0–15.0)
Immature Granulocytes: 1 %
Lymphocytes Relative: 13 %
Lymphs Abs: 1.5 10*3/uL (ref 0.7–4.0)
MCH: 31.2 pg (ref 26.0–34.0)
MCHC: 33.1 g/dL (ref 30.0–36.0)
MCV: 94.2 fL (ref 80.0–100.0)
Monocytes Absolute: 0.5 10*3/uL (ref 0.1–1.0)
Monocytes Relative: 5 %
Neutro Abs: 8.6 10*3/uL — ABNORMAL HIGH (ref 1.7–7.7)
Neutrophils Relative %: 76 %
Platelets: 152 10*3/uL (ref 150–400)
RBC: 3.82 MIL/uL — ABNORMAL LOW (ref 3.87–5.11)
RDW: 12.9 % (ref 11.5–15.5)
WBC: 11.2 10*3/uL — ABNORMAL HIGH (ref 4.0–10.5)
nRBC: 0 % (ref 0.0–0.2)

## 2019-10-09 LAB — COMPREHENSIVE METABOLIC PANEL
ALT: 19 U/L (ref 0–44)
AST: 19 U/L (ref 15–41)
Albumin: 3.3 g/dL — ABNORMAL LOW (ref 3.5–5.0)
Alkaline Phosphatase: 58 U/L (ref 38–126)
Anion gap: 4 — ABNORMAL LOW (ref 5–15)
BUN: 6 mg/dL (ref 6–20)
CO2: 24 mmol/L (ref 22–32)
Calcium: 7.7 mg/dL — ABNORMAL LOW (ref 8.9–10.3)
Chloride: 108 mmol/L (ref 98–111)
Creatinine, Ser: 0.52 mg/dL (ref 0.44–1.00)
GFR calc Af Amer: >60 mL/min (ref >60)
GFR calc non Af Amer: >60 mL/min (ref >60)
Glucose, Bld: 211 mg/dL — ABNORMAL HIGH (ref 70–99)
Potassium: 3.5 mmol/L (ref 3.5–5.1)
Sodium: 136 mmol/L (ref 135–145)
Total Bilirubin: 1.2 mg/dL (ref 0.3–1.2)
Total Protein: 6.6 g/dL (ref 6.5–8.1)

## 2019-10-09 LAB — URINE CULTURE: Culture: NO GROWTH

## 2019-10-09 LAB — HEMOGLOBIN A1C
Hgb A1c MFr Bld: 8.1 % — ABNORMAL HIGH (ref 4.8–5.6)
Mean Plasma Glucose: 185.77 mg/dL

## 2019-10-09 LAB — TSH: TSH: 1.559 u[IU]/mL (ref 0.350–4.500)

## 2019-10-09 LAB — GLUCOSE, CAPILLARY
Glucose-Capillary: 207 mg/dL — ABNORMAL HIGH (ref 70–99)
Glucose-Capillary: 206 mg/dL — ABNORMAL HIGH (ref 70–99)
Glucose-Capillary: 206 mg/dL — ABNORMAL HIGH (ref 70–99)
Glucose-Capillary: 210 mg/dL — ABNORMAL HIGH (ref 70–99)

## 2019-10-09 LAB — LACTIC ACID, PLASMA
Lactic Acid, Venous: 1.1 mmol/L (ref 0.5–1.9)
Lactic Acid, Venous: 1.2 mmol/L (ref 0.5–1.9)

## 2019-10-09 LAB — MAGNESIUM: Magnesium: 1.9 mg/dL (ref 1.7–2.4)

## 2019-10-09 LAB — FIBRIN DERIVATIVES D-DIMER (ARMC ONLY): Fibrin derivatives D-dimer (ARMC): 689.27 ng/mL (FEU) — ABNORMAL HIGH (ref 0.00–499.00)

## 2019-10-09 LAB — STREP PNEUMONIAE URINARY ANTIGEN: Strep Pneumo Urinary Antigen: NEGATIVE

## 2019-10-09 MED ORDER — DM-GUAIFENESIN ER 30-600 MG PO TB12
1.0000 | ORAL_TABLET | Freq: Two times a day (BID) | ORAL | Status: DC
  Administered 2019-10-09 – 2019-10-10 (×2): 1 via ORAL
  Filled 2019-10-09 (×2): qty 1

## 2019-10-09 MED ORDER — IPRATROPIUM-ALBUTEROL 0.5-2.5 (3) MG/3ML IN SOLN
3.0000 mL | Freq: Four times a day (QID) | RESPIRATORY_TRACT | Status: DC
  Filled 2019-10-09 (×2): qty 3

## 2019-10-09 MED ORDER — ONDANSETRON HCL 4 MG/2ML IJ SOLN
4.0000 mg | Freq: Four times a day (QID) | INTRAMUSCULAR | Status: DC | PRN
  Administered 2019-10-09 – 2019-10-10 (×2): 4 mg via INTRAVENOUS
  Filled 2019-10-09 (×2): qty 2

## 2019-10-09 MED ORDER — PAROXETINE HCL 30 MG PO TABS
30.0000 mg | ORAL_TABLET | Freq: Every day | ORAL | Status: DC
  Administered 2019-10-10: 10:00:00 30 mg via ORAL
  Filled 2019-10-09: qty 1

## 2019-10-09 MED ORDER — SODIUM CHLORIDE 0.9 % IV BOLUS
500.0000 mL | Freq: Once | INTRAVENOUS | Status: AC
  Administered 2019-10-09: 500 mL via INTRAVENOUS

## 2019-10-09 NOTE — Progress Notes (Signed)
Ambulated patient to the bathroom at this point. Patients Heart rate increased to 164 with ambulation. Patient did not seem to be asymptomatic however could feel her Heart racing. I have made provider aware, awaiting response at this time. At rest now and heart rate 109 bpm. Safety checks completed and call light placed within reach. Will continue to monitor.

## 2019-10-09 NOTE — Progress Notes (Signed)
Assumed care of patient this evening. Alert oriented and pleasant at this time. Does have c/o chest discomfort related to deep breathes. Patient given Morphine with good effect. Patient  Has been running a Tachy heart rate in the low 100' s however has intermittent bought's of rates between 130-140's I have advised the provider at this time. Patient continues to asymptomatic and resting when these elevation occur. Safety checks completed and call light placed within reach. Will continue to monitor.

## 2019-10-09 NOTE — Progress Notes (Signed)
Patient now feeling nauseated at this time. Provider contacted for nausea medication at this time. Denies chest pain, on pain felt with deep breathes. Will continue to monitor.

## 2019-10-09 NOTE — Progress Notes (Addendum)
PROGRESS NOTE    Rebecca Ponce  EPP:[id number] DOB: 12-06-1990 DOA: 10/07/2019  PCP: Ronnell Freshwater, NP    LOS - 1   Brief Narrative:  Rebecca Ponce is a 29 y.o. female with medical history significant of major depressive disorder, gestational diabetes, morbid obesity, hyperlipidemia, organic insomnia who presents to the ER with progressive shortness of breath fever, fever/chills and cough.  Associated symptoms of left-sided chest pain and generalized weakness, 8 out of 10 back pain without trauma.  Only recent illness was UTI for which she was treated outpatient with Cipro.  Has history of recurrent UTIs.   In the ED, febrile 101, hypertensive 180/103, tachycardic 144, tachypneic 26.  O2 sat 94% on room air lactic acid 2.3.  Leukocytosis 19.7.  Remainder of CBC and CMP were normal.  Chest x-ray showed vague opacities in both lungs and left midlung zone consistent with atypical pneumonia.  COVID-19 negative, as was influenza A and B.  Patient admitted to hospitalist service, being treated with IV antibiotics for community-acquired pneumonia.   Subjective 3/28: Patient seen and examined at bedside this morning.  No acute events reported.   Today, patient reports feeling a little better.  States chest pain is improved, coughing less.  No fevers or chills.  Assessment & Plan:   Principal Problem:   CAP (community acquired pneumonia) Active Problems:   Acute respiratory failure with hypoxia (HCC)   Hyperglycemia   Depression, recurrent (HCC)   History of recurrent UTIs   Intractable low back pain   Hyponatremia  Acute hypoxic respiratory failure secondary to Community-acquired pneumonia  --Supplemental oxygen to maintain O2 sat greater than 90%, wean off as tolerated --Continue IV Rocephin and azithromycin --DuoNebs every 6 hours while awake --Albuterol nebs every 3 hours as needed --Mucinex twice daily  Hyperglycemia -patient's blood glucose has been running around 200.  She does  have a history of gestational diabetes.  She reports onset of diabetes in her mother in her mid 61s. --A1c is pending --Await A1c and likely will start Metformin --Sensitive sliding scale NovoLog for now  Back pain -seems chronic in nature --As needed Tylenol, tramadol, Norco for mild/moderate/severe pain respectively --As needed morphine for breakthrough pain --Recommend outpatient PT referral  Depression -resume home Paxil  Hyponatremia -present on admission with sodium 132, mild hyponatremia.  Resolved.  History of recurrent UTIs -recently treated with Cipro for UTI.  UA obtained was negative.  Patient currently asymptomatic.   DVT prophylaxis: Lovenox   Code Status: Full Code  Family Communication: None at bedside during encounter, patient to update  Disposition Plan: Expect home in 24 to 48 hours pending further clinical improvement and weaned off of oxygen. Coming From home Exp DC Date 3/29-30 Barriers as above Medically Stable for Discharge?  No  Consultants:   None  Procedures:   None  Antimicrobials:   Rocephin, azithromycin 3/27 >>   Objective: Vitals:   10/09/19 0337 10/09/19 0338 10/09/19 1135 10/09/19 1144  BP: 109/90  (!) 117/58   Pulse: (!) 110 (!) 108 79   Resp:   20   Temp:   98.1 F (36.7 C)   TempSrc:   Oral   SpO2: 100% 95%  99%  Weight:      Height:        Intake/Output Summary (Last 24 hours) at 10/09/2019 1646 Last data filed at 10/09/2019 1409 Gross per 24 hour  Intake 2850 ml  Output 300 ml  Net 2550 ml   [employer]  10/07/19 2005  Weight: 86.6 kg    Examination:  General exam: awake, alert, no acute distress, overweight HEENT: moist mucus membranes, hearing grossly normal  Respiratory system: Mostly clear on auscultation with occasional expiratory wheeze and diminished at the bases, normal respiratory effort. Cardiovascular system: normal S1/S2, RRR, no JVD, murmurs, rubs, gallops, no pedal edema.   Central nervous  system: A&O x4. no gross focal neurologic deficits, normal speech Extremities: moves all, no cyanosis, normal tone Skin: dry, intact, normal temperature, normal color Psychiatry: normal mood, congruent affect, judgement and insight appear normal    Data Reviewed: I have personally reviewed following labs and imaging studies  CBC: Recent Labs  Lab 10/07/19 2011 10/08/19 0545 10/09/19 0324  WBC 19.7* 15.1* 11.2*  NEUTROABS 16.7* 12.0* 8.6*  HGB 14.3 12.8 11.9*  HCT 42.3 38.1 36.0  MCV 91.8 93.2 94.2  PLT 183 165 152   Basic Metabolic Panel: Recent Labs  Lab 10/07/19 2011 10/08/19 0545 10/09/19 0324  NA 132* 137 136  K 3.8 3.6 3.5  CL 98 106 108  CO2 20* 22 24  GLUCOSE 299* 212* 211*  BUN 10 7 6   CREATININE 0.79 0.56 0.52  CALCIUM 8.9 8.3* 7.7*  MG  --   --  1.9   GFR: Estimated Creatinine Clearance: 98.4 mL/min (by C-G formula based on SCr of 0.52 mg/dL). Liver Function Tests: Recent Labs  Lab 10/07/19 2011 10/08/19 0545 10/09/19 0324  AST 27 19 19   ALT 22 19 19   ALKPHOS 76 93 58  BILITOT 0.8 1.2 1.2  PROT 7.5 6.7 6.6  ALBUMIN 4.0 3.5 3.3*   No results for input(s): LIPASE, AMYLASE in the last 168 hours. No results for input(s): AMMONIA in the last 168 hours. Coagulation Profile: No results for input(s): INR, PROTIME in the last 168 hours. Cardiac Enzymes: No results for input(s): CKTOTAL, CKMB, CKMBINDEX, TROPONINI in the last 168 hours. BNP (last 3 results) No results for input(s): PROBNP in the last 8760 hours. HbA1C: No results for input(s): HGBA1C in the last 72 hours. CBG: Recent Labs  Lab 10/08/19 1631 10/09/19 0300 10/09/19 0759 10/09/19 1134 10/09/19 1643  GLUCAP 230* 207* 206* 206* 210*   Lipid Profile: No results for input(s): CHOL, HDL, LDLCALC, TRIG, CHOLHDL, LDLDIRECT in the last 72 hours. Thyroid Function Tests: Recent Labs    10/09/19 0324  TSH 1.559   Anemia Panel: No results for input(s): VITAMINB12, FOLATE, FERRITIN,  TIBC, IRON, RETICCTPCT in the last 72 hours. Sepsis Labs: Recent Labs  Lab 10/07/19 2211 10/07/19 2356 10/08/19 1129 10/08/19 1434 10/09/19 0324 10/09/19 0550  PROCALCITON  --  <0.10  --   --   --   --   LATICACIDVEN   < >  --  2.0* 2.5* 1.1 1.2   < > = values in this interval not displayed.    Recent Results (from the past 240 hour(s))  Blood culture (routine x 2)     Status: None (Preliminary result)   Collection Time: 10/07/19  8:12 PM   Specimen: BLOOD  Result Value Ref Range Status   Specimen Description BLOOD RIGHT ANTECUBITAL  Final   Special Requests   Final    BOTTLES DRAWN AEROBIC AND ANAEROBIC Blood Culture adequate volume   Culture   Final    NO GROWTH 2 DAYS Performed at [facility], [address], [place], [facility] [address]    Report Status PENDING  Incomplete  Blood culture (routine x 2)  Status: None (Preliminary result)   Collection Time: 10/07/19  8:17 PM   Specimen: BLOOD  Result Value Ref Range Status   Specimen Description BLOOD  Final   Special Requests NONE  Final   Culture   Final    NO GROWTH 2 DAYS Performed at [facility], [address] [place], [place] [address]    Report Status PENDING  Incomplete  Respiratory Panel by RT PCR (Flu A&B, Covid) - Nasopharyngeal Swab     Status: None   Collection Time: 10/07/19 10:05 PM   Specimen: Nasopharyngeal Swab  Result Value Ref Range Status   SARS Coronavirus 2 by RT PCR NEGATIVE NEGATIVE Final    Comment: (NOTE) SARS-CoV-2 target nucleic acids are NOT DETECTED. The SARS-CoV-2 RNA is generally detectable in upper respiratoy specimens during the acute phase of infection. The lowest concentration of SARS-CoV-2 viral copies this assay can detect is 131 copies/mL. A negative result does not preclude SARS-Cov-2 infection and should not be used as the sole basis for treatment or other patient management decisions. A negative result may occur with  improper specimen  collection/handling, submission of specimen other than nasopharyngeal swab, presence of viral mutation(s) within the areas targeted by this assay, and inadequate number of viral copies (<131 copies/mL). A negative result must be combined with clinical observations, patient history, and epidemiological information. The expected result is Negative. Fact Sheet for Patients:  https://www.moore.com/ Fact Sheet for Healthcare Providers:  https://www.young.biz/ This test is not yet ap proved or cleared by the [place] FDA and  has been authorized for detection and/or diagnosis of SARS-CoV-2 by FDA under an Emergency Use Authorization (EUA). This EUA will remain  in effect (meaning this test can be used) for the duration of the COVID-19 declaration under Section 564(b)(1) of the Act, 21 U.S.C. section 360bbb-3(b)(1), unless the authorization is terminated or revoked sooner.    Influenza A by PCR NEGATIVE NEGATIVE Final   Influenza B by PCR NEGATIVE NEGATIVE Final    Comment: (NOTE) The Xpert Xpress SARS-CoV-2/FLU/RSV assay is intended as an aid in  the diagnosis of influenza from Nasopharyngeal swab specimens and  should not be used as a sole basis for treatment. Nasal washings and  aspirates are unacceptable for Xpert Xpress SARS-CoV-2/FLU/RSV  testing. Fact Sheet for Patients: https://www.moore.com/ Fact Sheet for Healthcare Providers: https://www.young.biz/ This test is not yet approved or cleared by the [place] FDA and  has been authorized for detection and/or diagnosis of SARS-CoV-2 by  FDA under an Emergency Use Authorization (EUA). This EUA will remain  in effect (meaning this test can be used) for the duration of the  Covid-19 declaration under Section 564(b)(1) of the Act, 21  U.S.C. section 360bbb-3(b)(1), unless the authorization is  terminated or revoked. Performed at [facility],  [address], [place], [place] [address]   Urine Culture     Status: None   Collection Time: 10/07/19 11:56 PM   Specimen: Urine, Clean Catch  Result Value Ref Range Status   Specimen Description   Final    URINE, CLEAN CATCH Performed at [facility], [address] [place], [place] [address]    Special Requests   Final    NONE Performed at [facility], [address], [place], [place] [address]    Culture   Final    NO GROWTH Performed at [facility], [address] [place], [place] [address]    Report Status 10/09/2019 FINAL  Final  Radiology Studies: DG Chest Port 1 View  Result Date: 10/07/2019 CLINICAL DATA:  Left-sided chest pain. Shortness of breath. Weakness. EXAM: PORTABLE CHEST 1 VIEW COMPARISON:  None. FINDINGS: Normal heart size and mediastinal contours. Vague opacities in both lung bases and left midlung zone. No pulmonary edema, large pleural effusion or pneumothorax. No acute osseous abnormalities are seen. IMPRESSION: Vague opacities in both lung bases and left midlung zone, may represent atelectasis or atypical viral pneumonia, including COVID-19. Electronically Signed   By: Narda Rutherford M.D.   On: 10/07/2019 20:32        Scheduled Meds: . dextromethorphan-guaiFENesin  1 tablet Oral BID  . enoxaparin (LOVENOX) injection  40 mg Subcutaneous Q24H  . ipratropium-albuterol  3 mL Nebulization Q6H   Continuous Infusions: . azithromycin Stopped (10/09/19 0301)  . cefTRIAXone (ROCEPHIN)  IV Stopped (10/09/19 0339)     LOS: 1 day    Time spent: 35 minutes    Pennie Banter, DO [facility]   If 7PM-7AM, please contact night-coverage [email or web address] 10/09/2019, 4:46 PM

## 2019-10-09 NOTE — Progress Notes (Signed)
OVERNIGHT Nurse called report worsening tachycardia, aggravated with activity. Pressure stable. Increased oxygen requirement  Patient reports pleuritic type pain with breathing, coughing, ambulating She was given 500 cc crystalloids initially.. Ortho vs are normal. Now   TSH normal,  "D dimer not signifcantly elevated"  In discussion with nurse, worsening tachycardia ? Related to inhaler therapy - changed to xopenex

## 2019-10-10 ENCOUNTER — Inpatient Hospital Stay: Payer: Commercial Managed Care - PPO

## 2019-10-10 ENCOUNTER — Encounter: Payer: Self-pay | Admitting: Internal Medicine

## 2019-10-10 DIAGNOSIS — Z87440 Personal history of urinary (tract) infections: Secondary | ICD-10-CM

## 2019-10-10 DIAGNOSIS — J9601 Acute respiratory failure with hypoxia: Secondary | ICD-10-CM

## 2019-10-10 LAB — STREP PNEUMONIAE URINARY ANTIGEN: Strep Pneumo Urinary Antigen: NEGATIVE

## 2019-10-10 LAB — CBC WITH DIFFERENTIAL/PLATELET
Abs Immature Granulocytes: 0.08 10*3/uL — ABNORMAL HIGH (ref 0.00–0.07)
Basophils Absolute: 0.1 10*3/uL (ref 0.0–0.1)
Basophils Relative: 0 %
Eosinophils Absolute: 0.7 10*3/uL — ABNORMAL HIGH (ref 0.0–0.5)
Eosinophils Relative: 6 %
HCT: 38.2 % (ref 36.0–46.0)
Hemoglobin: 12.5 g/dL (ref 12.0–15.0)
Immature Granulocytes: 1 %
Lymphocytes Relative: 19 %
Lymphs Abs: 2.2 10*3/uL (ref 0.7–4.0)
MCH: 30.5 pg (ref 26.0–34.0)
MCHC: 32.7 g/dL (ref 30.0–36.0)
MCV: 93.2 fL (ref 80.0–100.0)
Monocytes Absolute: 0.4 10*3/uL (ref 0.1–1.0)
Monocytes Relative: 4 %
Neutro Abs: 8 10*3/uL — ABNORMAL HIGH (ref 1.7–7.7)
Neutrophils Relative %: 70 %
Platelets: 185 10*3/uL (ref 150–400)
RBC: 4.1 MIL/uL (ref 3.87–5.11)
RDW: 12.7 % (ref 11.5–15.5)
WBC: 11.5 10*3/uL — ABNORMAL HIGH (ref 4.0–10.5)
nRBC: 0 % (ref 0.0–0.2)

## 2019-10-10 LAB — GLUCOSE, CAPILLARY
Glucose-Capillary: 196 mg/dL — ABNORMAL HIGH (ref 70–99)
Glucose-Capillary: 220 mg/dL — ABNORMAL HIGH (ref 70–99)

## 2019-10-10 MED ORDER — IOHEXOL 9 MG/ML PO SOLN
500.0000 mL | ORAL | Status: AC
  Administered 2019-10-10 (×2): 500 mL via ORAL

## 2019-10-10 MED ORDER — SODIUM CHLORIDE 0.9 % IV SOLN
INTRAVENOUS | Status: DC | PRN
  Administered 2019-10-10: 1000 mL via INTRAVENOUS

## 2019-10-10 MED ORDER — CEFDINIR 300 MG PO CAPS: 300.0000 mg | ORAL_CAPSULE | Freq: Two times a day (BID) | ORAL | 0 refills | Status: AC

## 2019-10-10 MED ORDER — ALBUTEROL SULFATE HFA 108 (90 BASE) MCG/ACT IN AERS: 2.0000 | INHALATION_SPRAY | Freq: Four times a day (QID) | RESPIRATORY_TRACT | 0 refills | Status: DC | PRN

## 2019-10-10 MED ORDER — LIVING WELL WITH DIABETES BOOK: 1.0000 | Freq: Once | Status: AC

## 2019-10-10 MED ORDER — INSULIN ASPART 100 UNIT/ML ~~LOC~~ SOLN
0.0000 [IU] | Freq: Three times a day (TID) | SUBCUTANEOUS | Status: DC
  Administered 2019-10-10: 12:00:00 3 [IU] via SUBCUTANEOUS
  Filled 2019-10-10: qty 1

## 2019-10-10 MED ORDER — IOHEXOL 300 MG/ML  SOLN
100.0000 mL | Freq: Once | INTRAMUSCULAR | Status: AC | PRN
  Administered 2019-10-10: 09:00:00 100 mL via INTRAVENOUS

## 2019-10-10 MED ORDER — BLOOD GLUCOSE MONITOR KIT: PACK | 0 refills | Status: AC

## 2019-10-10 MED ORDER — LIVING WELL WITH DIABETES BOOK
Freq: Once | Status: AC
  Filled 2019-10-10: qty 1

## 2019-10-10 NOTE — Progress Notes (Signed)
Rebecca Ponce  A and O x 4. VSS. Pt tolerating diet well. No complaints of pain or nausea. IV removed intact, prescriptions given. Pt voiced understanding of discharge instructions with no further questions. Pt discharged via wheelchair with axillary.    Allergies as of 10/10/2019      Reactions   Latex Rash   States positive allergy test for latex   Penicillins Rash, Other (See Comments)   Fever   Latex    Penicillins       Medication List    STOP taking these medications   ciprofloxacin 500 MG tablet Commonly known as: Cipro   fluconazole 150 MG tablet Commonly known as: DIFLUCAN   Phendimetrazine Tartrate 35 MG Tabs   phentermine 37.5 MG tablet Commonly known as: ADIPEX-P     TAKE these medications   albuterol 108 (90 Base) MCG/ACT inhaler Commonly known as: VENTOLIN HFA Inhale 2 puffs into the lungs every 6 (six) hours as needed for wheezing or shortness of breath.   blood glucose meter kit and supplies Kit Dispense based on patient and insurance preference. Use up to four times daily as directed. (FOR ICD-9 250.00, 250.01).   cefdinir 300 MG capsule Commonly known as: OMNICEF Take 1 capsule (300 mg total) by mouth 2 (two) times daily for 4 days.   living well with diabetes book Misc 1 each by Does not apply route once for 1 dose.   PARoxetine 30 MG tablet Commonly known as: PAXIL Take 1 tablet (30 mg total) by mouth daily.       Vitals:   10/10/19 0525 10/10/19 1139  BP: 118/70 126/81  Pulse: 84 100  Resp: 20 17  Temp: 97.8 F (36.6 C) 98 F (36.7 C)  SpO2: 96% 97%    Francesco Sor

## 2019-10-10 NOTE — Discharge Summary (Addendum)
Physician Discharge Summary  Rebecca Ponce OIZ:[id number] DOB: Jul 27, 1990 DOA: 10/07/2019  PCP: Ronnell Freshwater, NP  Admit date: 10/07/2019 Discharge date: 10/26/2019  Admitted From: home Disposition:  home  Recommendations for Outpatient Follow-up:  Follow up with PCP in 1-2 weeks Please obtain BMP/CBC in one week Patient has Hgb A1c of 8.1%, new diagnosis of diabetes.  Metformin was not started on discharge because patient received IV contrast.  Please start patient on medication for diabetes.   Patient had gross hematuria (single episode) while in hospital.  CT abdomen/pelvis was negative for kidney stone, but did show hepatic steatosis.  Please follow up on this for further evaluation.  Home Health: No  Equipment/Devices: None   Discharge Condition: Stable  CODE STATUS: Full  Diet recommendation: Carb Modified   Brief/Interim Summary:  Rebecca Ponce is a 29 y.o. female with medical history significant of major depressive disorder, gestational diabetes, morbid obesity, hyperlipidemia, organic insomnia who presents to the ER with progressive shortness of breath fever, fever/chills and cough.  Associated symptoms of left-sided chest pain and generalized weakness, 8 out of 10 back pain without trauma.  Only recent illness was UTI for which she was treated outpatient with Cipro.  Has history of recurrent UTIs.   In the ED, febrile 101, hypertensive 180/103, tachycardic 144, tachypneic 26.  O2 sat 94% on room air lactic acid 2.3.  Leukocytosis 19.7.  Remainder of CBC and CMP were normal.  Chest x-ray showed vague opacities in both lungs and left midlung zone consistent with atypical pneumonia.  COVID-19 negative, as was influenza A and B.  Patient admitted to hospitalist service, being treated with IV antibiotics for community-acquired pneumonia.  Sepsis secondary to CAP, present on admission as evidenced by tachycardia, fever and pneumonia.  Sepsis resolved.  Acute hypoxic respiratory  failure secondary to Community-acquired pneumonia  Treated with supplemental oxygen, IV Rocephin and Azithromycin, bronchodilators, Mucinex.  Improved and weaned off oxygen.  Discharged with Omnicef to complete course of therapy. Follow up with PCP.   New onset type 2 diabetes  Hyperglycemia -patient's blood glucose has been running around 200. History of gestational diabetes.  Family history with her mother diagnosed in her mid 45s.  Hbg A1c 8.1%. --Await A1c and likely will start Metformin --Sensitive sliding scale NovoLog for now   Back pain -seems chronic in nature --As needed Tylenol, tramadol, Norco for mild/moderate/severe pain respectively --As needed morphine for breakthrough pain --Recommend outpatient PT referral   Depression -resume home Paxil   Hyponatremia -present on admission with sodium 132, mild hyponatremia.  Resolved.   History of recurrent UTIs -recently treated with Cipro for UTI.  UA obtained was negative.  Patient currently asymptomatic.  Had episode of gross hematuria which she reports history of with UTI's.     Discharge Diagnoses: Principal Problem:   CAP (community acquired pneumonia) Active Problems:   Acute respiratory failure with hypoxia (HCC)   Hyperglycemia   Depression, recurrent ([place])   History of recurrent UTIs   Intractable low back pain   Hyponatremia   Sepsis secondary to CAP   Discharge Instructions   Discharge Instructions     Call MD for:  extreme fatigue   Complete by: As directed    Call MD for:  severe uncontrolled pain   Complete by: As directed    Call MD for:  temperature >100.4   Complete by: As directed    Diet - low sodium heart healthy   Complete by: As directed  Discharge instructions   Complete by: As directed    Take antibiotic for 4 more days for pneumonia.  Please see PCP next week regarding new diagnosis of diabetes, and getting started on medication.   Increase activity slowly   Complete by: As  directed       Allergies as of 10/10/2019       Reactions   Latex Rash   States positive allergy test for latex   Penicillins Rash, Other (See Comments)   Fever   Latex    Penicillins         Medication List     STOP taking these medications    ciprofloxacin 500 MG tablet Commonly known as: Cipro   fluconazole 150 MG tablet Commonly known as: DIFLUCAN   Phendimetrazine Tartrate 35 MG Tabs   phentermine 37.5 MG tablet Commonly known as: ADIPEX-P       TAKE these medications    albuterol 108 (90 Base) MCG/ACT inhaler Commonly known as: VENTOLIN HFA Inhale 2 puffs into the lungs every 6 (six) hours as needed for wheezing or shortness of breath.   blood glucose meter kit and supplies Kit Dispense based on patient and insurance preference. Use up to four times daily as directed. (FOR ICD-9 250.00, 250.01).   PARoxetine 30 MG tablet Commonly known as: PAXIL Take 1 tablet (30 mg total) by mouth daily.       ASK your doctor about these medications    cefdinir 300 MG capsule Commonly known as: OMNICEF Take 1 capsule (300 mg total) by mouth 2 (two) times daily for 4 days. Ask about: Should I take this medication?   living well with diabetes book Misc 1 each by Does not apply route once for 1 dose. Ask about: Should I take this medication?         Allergies  Allergen Reactions   Latex Rash    States positive allergy test for latex   Penicillins Rash and Other (See Comments)    Fever   Latex    Penicillins     Consultations: none    Procedures/Studies: CT ABDOMEN PELVIS W CONTRAST  Result Date: 10/10/2019 CLINICAL DATA:  Inpatient. Hematuria for 2 days. Recent UTI. Mid to lower abdominal pain today. Nausea vomiting. Prior tubal ligation. EXAM: CT ABDOMEN AND PELVIS WITH CONTRAST TECHNIQUE: Multidetector CT imaging of the abdomen and pelvis was performed using the standard protocol following bolus administration of intravenous contrast. CONTRAST:   178m OMNIPAQUE IOHEXOL 300 MG/ML  SOLN COMPARISON:  11/25/2011 unenhanced CT abdomen/pelvis. FINDINGS: Lower chest: No significant pulmonary nodules or acute consolidative airspace disease. Hepatobiliary: Suggestion of diffuse hepatic steatosis. No definite liver surface irregularity. No liver masses. Cholecystectomy. No biliary ductal dilatation. Pancreas: Normal, with no mass or duct dilation. Spleen: Normal size. No mass. Adrenals/Urinary Tract: Normal adrenals. Normal kidneys with no hydronephrosis and no renal mass. Normal bladder. Stomach/Bowel: Normal non-distended stomach. Normal caliber small bowel with no small bowel wall thickening. Normal appendix. Oral contrast transits to the rectum. Normal large bowel with no diverticulosis, large bowel wall thickening or pericolonic fat stranding. Vascular/Lymphatic: Normal caliber abdominal aorta. Patent portal, splenic, hepatic and renal veins. No pathologically enlarged lymph nodes in the abdomen or pelvis. Reproductive: Grossly normal uterus.  No adnexal mass. Other: No pneumoperitoneum, ascites or focal fluid collection. Musculoskeletal: No aggressive appearing focal osseous lesions. Mild degenerative disc disease at L5-S1. IMPRESSION: 1. No acute abnormality. Unremarkable kidneys and bladder on this routine CT study. If the patient's hematuria  persists, a dedicated hematuria protocol CT abdomen/pelvis without and with IV contrast could be considered. 2. Suggestion of diffuse hepatic steatosis. Electronically Signed   By: Ilona Sorrel M.D.   On: 10/10/2019 09:38   DG Chest Port 1 View  Result Date: 10/07/2019 CLINICAL DATA:  Left-sided chest pain. Shortness of breath. Weakness. EXAM: PORTABLE CHEST 1 VIEW COMPARISON:  None. FINDINGS: Normal heart size and mediastinal contours. Vague opacities in both lung bases and left midlung zone. No pulmonary edema, large pleural effusion or pneumothorax. No acute osseous abnormalities are seen. IMPRESSION: Vague  opacities in both lung bases and left midlung zone, may represent atelectasis or atypical viral pneumonia, including COVID-19. Electronically Signed   By: Keith Rake M.D.   On: 10/07/2019 20:32      Subjective: Patient reports feeling better.  Cough improved.  No SOB.  Off oxygen.     Discharge Exam: Vitals:   10/10/19 0525 10/10/19 1139  BP: 118/70 126/81  Pulse: 84 100  Resp: 20 17  Temp: 97.8 F (36.6 C) 98 F (36.7 C)  SpO2: 96% 97%   Vitals:   10/09/19 2030 10/09/19 2041 10/10/19 0525 10/10/19 1139  BP: 120/74  118/70 126/81  Pulse: 96  84 100  Resp: _0 Temp: 98.8 F (37.1 C)  97.8 F (36.6 C) 98 F (36.7 C)  TempSrc: Oral  Oral Oral  SpO2: 95% 97% 96% 97%  Weight:      Height:        General: Pt is alert, awake, not in acute distress Cardiovascular: RRR, S1/S2 +, no rubs, no gallops Respiratory: CTA bilaterally, no wheezing, no rhonchi Abdominal: Soft, NT, ND, bowel sounds + Extremities: no edema, no cyanosis    The results of significant diagnostics from this hospitalization (including imaging, microbiology, ancillary and laboratory) are listed below for reference.     Microbiology: No results found for this or any previous visit (from the past 240 hour(s)).   Labs: BNP (last 3 results) No results for input(s): BNP in the last 8760 hours. Basic Metabolic Panel: No results for input(s): NA, K, CL, CO2, GLUCOSE, BUN, CREATININE, CALCIUM, MG, PHOS in the last 168 hours. Liver Function Tests: No results for input(s): AST, ALT, ALKPHOS, BILITOT, PROT, ALBUMIN in the last 168 hours. No results for input(s): LIPASE, AMYLASE in the last 168 hours. No results for input(s): AMMONIA in the last 168 hours. CBC: No results for input(s): WBC, NEUTROABS, HGB, HCT, MCV, PLT in the last 168 hours. Cardiac Enzymes: No results for input(s): CKTOTAL, CKMB, CKMBINDEX, TROPONINI in the last 168 hours. BNP: Invalid input(s): POCBNP CBG: No results for  input(s): GLUCAP in the last 168 hours. D-Dimer No results for input(s): DDIMER in the last 72 hours. Hgb A1c Recent Labs    10/25/19 1335  HGBA1C 8.3*   Lipid Profile No results for input(s): CHOL, HDL, LDLCALC, TRIG, CHOLHDL, LDLDIRECT in the last 72 hours. Thyroid function studies No results for input(s): TSH, T4TOTAL, T3FREE, THYROIDAB in the last 72 hours.  Invalid input(s): FREET3 Anemia work up No results for input(s): VITAMINB12, FOLATE, FERRITIN, TIBC, IRON, RETICCTPCT in the last 72 hours. Urinalysis    Component Value Date/Time   COLORURINE STRAW (A) 10/09/2019 2037   APPEARANCEUR CLEAR (A) 10/09/2019 2037   APPEARANCEUR Clear 02/03/2019 1533   LABSPEC 1.009 10/09/2019 2037   LABSPEC 1.027 03/17/2012 1306   PHURINE 9.0 (H) 10/09/2019 2037   GLUCOSEU >=500 (A) 10/09/2019 2037   GLUCOSEU Negative  03/17/2012 1306   [place] (A) 10/09/2019 2037   BILIRUBINUR NEGATIVE 10/09/2019 2037   BILIRUBINUR Negative 02/03/2019 1533   BILIRUBINUR Negative 03/17/2012 [place] 10/09/2019 2037   PROTEINUR NEGATIVE 10/09/2019 2037   UROBILINOGEN 0.2 07/02/2018 1418   UROBILINOGEN 0.2 10/06/2009 0225   NITRITE NEGATIVE 10/09/2019 2037   LEUKOCYTESUR NEGATIVE 10/09/2019 2037   LEUKOCYTESUR Negative 03/17/2012 1306   Sepsis Labs Invalid input(s): PROCALCITONIN,  WBC,  LACTICIDVEN Microbiology No results found for this or any previous visit (from the past 240 hour(s)).   Time coordinating discharge: Over 30 minutes  SIGNED:   Ezekiel Slocumb, DO [facility] 10/26/2019, 4:11 PM   If 7PM-7AM, please contact night-coverage [email or web address]

## 2019-10-10 NOTE — Progress Notes (Signed)
Inpatient Diabetes Program Recommendations  AACE/ADA: New Consensus Statement on Inpatient Glycemic Control (2015)  Target Ranges:  Prepandial:   less than 140 mg/dL      Peak postprandial:   less than 180 mg/dL (1-2 hours)      Critically ill patients:  140 - 180 mg/dL   Lab Results  Component Value Date   GLUCAP 220 (H) 10/10/2019   HGBA1C 8.1 (H) 10/09/2019    Review of Glycemic Control Results for Rebecca Ponce, Rebecca Ponce (MRN [id number]) as of 10/10/2019 13:45  Ref. Range 10/09/2019 07:59 10/09/2019 11:34 10/09/2019 16:43 10/10/2019 08:03 10/10/2019 11:40  Glucose-Capillary Latest Ref Range: 70 - 99 mg/dL 408 (H) 144 (H) 818 (H) 196 (H) 220 (H)   Diabetes history: DM 2 (New diagnosis/Hx. Of gestational DM) Outpatient Diabetes medications: None Current orders for Inpatient glycemic control:  Novolog sensitive tid with meals   Inpatient Diabetes Program Recommendations:    Spoke with pt about new diagnosis.  Discussed A1C results with him and explained what an A1C is, basic pathophysiology of DM Type 2, basic home care, importance of checking CBGs and maintaining good CBG control to prevent long-term and short-term complications.  Reviewed signs and symptoms of hyperglycemia and hypoglycemia.  RNs to provide ongoing basic DM education at bedside with this patient.  Have ordered educational booklet for patient as well. Patient listened but still seemed to feel bad and was sleepy.  She verbalized need to f/u with PCP.  Discussed need for checking blood sugars as well and reviewed normal blood sugar values.   Will request glucose meter/strips for patient as well.   Thanks,  Beryl Meager, RN, BC-ADM Inpatient Diabetes Coordinator Pager [phone] (8a-5p)

## 2019-10-10 NOTE — Progress Notes (Signed)
OVERNIIGHT New onset hematuria. No abdominal or flank pain. Patient reports she has experienced hematuria in the past when she has UTI.  No abdominal or flank pain. UA with red cells, no infection indicators.  CT abed/pelvis pending, rule out stone, glomerulosclerosis,

## 2019-10-10 NOTE — Plan of Care (Signed)
  RD consulted for nutrition education regarding diabetes.   Lab Results  Component Value Date   HGBA1C 8.1 (H) 10/09/2019   Met with patient at bedside. She reports that this is a new diagnosis of diabetes for her but she reports her mother has diabetes so she is somewhat familiar with the management. She reports she is unsure how many meals she typically eats at home. She reports she typically has chicken with vegetables at meals and drinks water or coffee during the day. When RD asked about intake of different foods that contain carbohydrate she reports she does not eat many of them. Discussed importance of consistent carbohydrate intake throughout the day.  RD provided "Carbohydrate Counting for People with Diabetes" handout from the Academy of [employer]. Discussed different food groups and their effects on blood sugar, emphasizing carbohydrate-containing foods. Provided list of carbohydrates and recommended serving sizes of common foods.  Discussed importance of controlled and consistent carbohydrate intake throughout the day. Provided examples of ways to balance meals/snacks and encouraged intake of high-fiber, whole grain complex carbohydrates. Teach back method used.  Expect good compliance.  Current diet order is carbohydrate modified, patient is consuming approximately 100% of meals at this time. Labs and medications reviewed. No further nutrition interventions warranted at this time. RD contact information provided. If additional nutrition issues arise, please re-consult RD.  Jacklynn Barnacle, MS, RD, LDN Pager number available on Amion

## 2019-10-12 LAB — CULTURE, BLOOD (ROUTINE X 2)
Culture: NO GROWTH
Culture: NO GROWTH
Special Requests: ADEQUATE

## 2019-10-21 ENCOUNTER — Telehealth: Payer: Self-pay

## 2019-10-21 NOTE — Telephone Encounter (Signed)
Lmom to confirm and screen for 10-25-19 ov. 

## 2019-10-25 ENCOUNTER — Encounter: Payer: Self-pay | Admitting: Nurse Practitioner

## 2019-10-25 ENCOUNTER — Ambulatory Visit (INDEPENDENT_AMBULATORY_CARE_PROVIDER_SITE_OTHER): Payer: Commercial Managed Care - PPO | Admitting: Nurse Practitioner

## 2019-10-25 ENCOUNTER — Other Ambulatory Visit: Payer: Self-pay

## 2019-10-25 ENCOUNTER — Telehealth: Payer: Self-pay

## 2019-10-25 VITALS — BP 115/72 | HR 103 | Temp 98.3°F | Resp 16 | Ht 59.0 in | Wt 186.0 lb

## 2019-10-25 DIAGNOSIS — J189 Pneumonia, unspecified organism: Secondary | ICD-10-CM

## 2019-10-25 DIAGNOSIS — E1165 Type 2 diabetes mellitus with hyperglycemia: Secondary | ICD-10-CM

## 2019-10-25 DIAGNOSIS — J4520 Mild intermittent asthma, uncomplicated: Secondary | ICD-10-CM

## 2019-10-25 DIAGNOSIS — R319 Hematuria, unspecified: Secondary | ICD-10-CM

## 2019-10-25 DIAGNOSIS — Z09 Encounter for follow-up examination after completed treatment for conditions other than malignant neoplasm: Secondary | ICD-10-CM | POA: Insufficient documentation

## 2019-10-25 LAB — POCT GLYCOSYLATED HEMOGLOBIN (HGB A1C): Hemoglobin A1C: 8.3 % — AB (ref 4.0–5.6)

## 2019-10-25 MED ORDER — GLUCOSE BLOOD VI STRP: ORAL_STRIP | 12 refills | Status: DC

## 2019-10-25 MED ORDER — METFORMIN HCL 500 MG PO TABS: 500.0000 mg | ORAL_TABLET | Freq: Two times a day (BID) | ORAL | 3 refills | Status: DC

## 2019-10-25 MED ORDER — ONETOUCH ULTRASOFT LANCETS MISC: 12 refills | Status: DC

## 2019-10-25 NOTE — Telephone Encounter (Signed)
Confirmed appointment on 10/26/2019 and screened for covid. klh 

## 2019-10-25 NOTE — Progress Notes (Signed)
[facility] [place], [place] [address]  Internal MEDICINE  Office Visit Note  Patient Name: Rebecca Ponce  [id number]  [id number]  Date of Service: 10/25/2019     Chief Complaint  Patient presents with  . Follow-up    increased paxil, changed phentermine to bontril, pt quit taking bontril      The patient is here for hospital follow up. Was hospitalized on 10/07/2019 through 10/11/2019 for bilateral pneumonia. Her husband had to be out of work to help with their three children and help his wife recover until 10/17/2019. She was negative for COVID 19.  While hospitalized, the patient was diagnosed with Type 2 diabetes. Blood sugars were elevated and her HgbA1c was 8.1. She has been checking blood sugars at home and her sugars have been running in the high 100 and 200s. Today, her HgbA1c is 8.3. she does need to have a new prescription for test strips and lancets for home use. She is not currently on maintenance medication for diabetes.  She was released from the hospital with rescue inhaler. She states that she has had to use this a few times every day since her discharge. She states that hospital feels as though she might have asthma. Unsure if her wheezing may be due to the pneumonia.  She also had large hematuria during her hospital stay. No bacterial was cultured from her urine. CT of the abdomen and pelvis was done and she did not have any acute abnormalities with the kidneys at that time.    Pt is here for recent hospital follow up.  Current Medication: Outpatient Encounter Medications as of 10/25/2019  Medication Sig  . albuterol (VENTOLIN HFA) 108 (90 Base) MCG/ACT inhaler Inhale 2 puffs into the lungs every 6 (six) hours as needed for wheezing or shortness of breath.  . blood glucose meter kit and supplies KIT Dispense based on patient and insurance preference. Use up to four times daily as directed. (FOR ICD-9 250.00, 250.01).  Marland Kitchen PARoxetine (PAXIL) 30 MG  tablet Take 1 tablet (30 mg total) by mouth daily.  Marland Kitchen glucose blood test strip Blood sugar testing is done twice daily  . Lancets (ONETOUCH ULTRASOFT) lancets Blood sugar testing is done twice daily  . metFORMIN (GLUCOPHAGE) 500 MG tablet Take 1 tablet (500 mg total) by mouth 2 (two) times daily with a meal.   No facility-administered encounter medications on file as of 10/25/2019.    Surgical History: Past Surgical History:  Procedure Laterality Date  . CESAREAN SECTION     twice  . CESAREAN SECTION    . CESAREAN SECTION WITH BILATERAL TUBAL LIGATION N/A 06/11/2015   Procedure: CESAREAN SECTION WITH BILATERAL TUBAL LIGATION;  Surgeon: Malachy Mood, MD;  Location: [facility] ORS;  Service: Obstetrics;  Laterality: N/A;  . CHOLECYSTECTOMY    . TONSILLECTOMY    . TONSILLECTOMY      Medical History: Past Medical History:  Diagnosis Date  . Anemia   . Depression   . Gestational diabetes   . Hyperlipidemia   . Insomnia     Family History: Family History  Problem Relation Age of Onset  . Anxiety disorder Mother   . Diabetes Mother   . Hypertension Father   . Diabetes Maternal Aunt     Social History   Socioeconomic History  . Marital status: Married    Spouse name: Not on file  . Number of children: Not on file  . Years of education: Not on file  .  Highest education level: Not on file  Occupational History  . Not on file  Tobacco Use  . Smoking status: Never Smoker  . Smokeless tobacco: Never Used  . Tobacco comment: years ago  Substance and Sexual Activity  . Alcohol use: Yes    Comment: socially   . Drug use: Never  . Sexual activity: Not on file  Other Topics Concern  . Not on file  Social History Narrative   ** Merged History Encounter **       Social Determinants of Health   Financial Resource Strain:   . Difficulty of Paying Living Expenses:   Food Insecurity:   . Worried About Charity fundraiser in the Last Year:   . Arboriculturist in the Last  Year:   Transportation Needs:   . Film/video editor (Medical):   Marland Kitchen Lack of Transportation (Non-Medical):   Physical Activity:   . Days of Exercise per Week:   . Minutes of Exercise per Session:   Stress:   . Feeling of Stress :   Social Connections:   . Frequency of Communication with Friends and Family:   . Frequency of Social Gatherings with Friends and Family:   . Attends Religious Services:   . Active Member of Clubs or Organizations:   . Attends Archivist Meetings:   Marland Kitchen Marital Status:   Intimate Partner Violence:   . Fear of Current or Ex-Partner:   . Emotionally Abused:   Marland Kitchen Physically Abused:   . Sexually Abused:       Review of Systems  Constitutional: Negative for chills, fatigue and unexpected weight change.  HENT: Negative for congestion, postnasal drip, rhinorrhea, sneezing and sore throat.   Respiratory: Positive for wheezing. Negative for cough, chest tightness and shortness of breath.        Intermittent wheezing since having pneumonia. Using rescue inhaler is helpful.   Cardiovascular: Negative for chest pain and palpitations.  Gastrointestinal: Negative for abdominal pain, constipation, diarrhea, nausea and vomiting.  Endocrine:       Patient has been checking blood sugars at home since discharge from hospital. Running in high 100s and low 200s   Genitourinary: Positive for hematuria.  Musculoskeletal: Negative for arthralgias, back pain, joint swelling and neck pain.  Skin: Negative for rash.  Allergic/Immunologic: Positive for environmental allergies.  Neurological: Negative for dizziness, tremors, numbness and headaches.  Hematological: Negative for adenopathy. Does not bruise/bleed easily.  Psychiatric/Behavioral: Negative for behavioral problems (Depression), sleep disturbance and suicidal ideas. The patient is not nervous/anxious.    Today's Vitals   10/25/19 1108  BP: 115/72  Pulse: (!) 103  Resp: 16  Temp: 98.3 F (36.8 C)   SpO2: 96%  Weight: 186 lb (84.4 kg)  Height: 4' 11"  (1.499 m)   Body mass index is 37.57 kg/m.  Physical Exam Vitals and nursing note reviewed.  Constitutional:      General: She is not in acute distress.    Appearance: Normal appearance. She is well-developed. She is not diaphoretic.  HENT:     Head: Normocephalic and atraumatic.     Nose: Nose normal.     Mouth/Throat:     Pharynx: No oropharyngeal exudate.  Eyes:     Pupils: Pupils are equal, round, and reactive to light.  Neck:     Thyroid: No thyromegaly.     Vascular: No JVD.     Trachea: No tracheal deviation.  Cardiovascular:     Rate and Rhythm:  Normal rate and regular rhythm.     Heart sounds: Normal heart sounds. No murmur. No friction rub. No gallop.   Pulmonary:     Effort: Pulmonary effort is normal. No respiratory distress.     Breath sounds: Normal breath sounds. No wheezing or rales.  Chest:     Chest wall: No tenderness.  Abdominal:     General: Bowel sounds are normal.     Palpations: Abdomen is soft.     Tenderness: There is no abdominal tenderness.  Musculoskeletal:        General: Normal range of motion.     Cervical back: Normal range of motion and neck supple.  Lymphadenopathy:     Cervical: No cervical adenopathy.  Skin:    General: Skin is warm and dry.  Neurological:     Mental Status: She is alert and oriented to person, place, and time.     Cranial Nerves: No cranial nerve deficit.  Psychiatric:        Mood and Affect: Mood normal.        Behavior: Behavior normal.        Thought Content: Thought content normal.        Judgment: Judgment normal.    Assessment/Plan: 1. Hospital discharge follow-up Patient hospitalized for bilateral pneumonia from 10/07/2019 through 10/11/2019. Reviewed all notes, images, and labs with the patient. Her husband missed work from 10/07/2019 through 10/17/2019 to help take care of three young children and help his wife recover from illness. FMLA papers to be  filled out and returned to the patient as soon as possible.   2. Community acquired pneumonia, unspecified laterality Patient had bilateral pneumonia leading to her hospital admission. She was negative for COVID 19. Received medication in hospital. Is doing well now and has returned to work.   3. Type 2 diabetes mellitus with hyperglycemia, without long-term current use of insulin (HCC) HgbA1c 83 today. Started metformin 540m bid. Advised she continue with checking blood sugars twice daily. Reviewed diabetic diet.  - glucose blood test strip; Blood sugar testing is done twice daily  Dispense: 100 each; Refill: 12 - Lancets (ONETOUCH ULTRASOFT) lancets; Blood sugar testing is done twice daily  Dispense: 100 each; Refill: 12 - metFORMIN (GLUCOPHAGE) 500 MG tablet; Take 1 tablet (500 mg total) by mouth 2 (two) times daily with a meal.  Dispense: 60 tablet; Refill: 3  4. Mild intermittent asthma without complication She should continue to use rescue inhaler as needed and as prescribed. Report on frequency of use at next visit. May need PFT and referral to pulmonology.   5. Hematuria, unspecified type Check u/a at next visit. If hematuria persistent, will get renal ultrasound. CT abdomen showed no acute abnormalities of kidneys.   General Counseling: KEdwinnaverbalizes understanding of the findings of todays visit and agrees with plan of treatment. I have discussed any further diagnostic evaluation that may be needed or ordered today. We also reviewed her medications today. she has been encouraged to call the office with any questions or concerns that should arise related to todays visit.    Counseling:  Diabetes Counseling:  1. Addition of ACE inh/ ARB'S for nephroprotection. Microalbumin is updated  2. Diabetic foot care, prevention of complications. Podiatry consult 3. Exercise and lose weight.  4. Diabetic eye examination, Diabetic eye exam is updated  5. Monitor blood sugar closlely.  nutrition counseling.  6. Sign and symptoms of hypoglycemia including shaking sweating,confusion and headaches.  This patient was seen by  Leretha Pol FNP Collaboration with Dr Lavera Guise as a part of collaborative care agreement  Orders Placed This Encounter  Procedures  . POCT glycosylated hemoglobin (Hb A1C)      I have reviewed all medical records from hospital follow up including radiology reports and consults from other physicians. Appropriate follow up diagnostics will be scheduled as needed. Patient/ Family understands the plan of treatment. Time spent 45 minutes.   Dr Lavera Guise, MD Internal Medicine

## 2019-10-26 ENCOUNTER — Other Ambulatory Visit: Payer: Self-pay

## 2019-10-26 DIAGNOSIS — E1165 Type 2 diabetes mellitus with hyperglycemia: Secondary | ICD-10-CM

## 2019-10-26 MED ORDER — ONETOUCH ULTRA VI STRP: ORAL_STRIP | 12 refills | Status: DC

## 2019-10-26 MED ORDER — ONETOUCH ULTRASOFT LANCETS MISC: 12 refills | Status: DC

## 2019-10-27 ENCOUNTER — Telehealth: Payer: Self-pay

## 2019-10-27 NOTE — Telephone Encounter (Signed)
Patient paperwork up front ready for pick up. Rebecca Ponce

## 2019-11-23 ENCOUNTER — Telehealth: Payer: Self-pay

## 2019-11-23 NOTE — Telephone Encounter (Signed)
Confirmed and screened for 11-25-19 ov. 

## 2019-11-25 ENCOUNTER — Ambulatory Visit: Payer: Commercial Managed Care - PPO | Admitting: Nurse Practitioner

## 2019-11-25 ENCOUNTER — Encounter: Payer: Self-pay | Admitting: Nurse Practitioner

## 2019-11-25 VITALS — BP 131/82 | HR 96 | Temp 98.1°F | Resp 16 | Ht 59.0 in | Wt 182.6 lb

## 2019-11-25 DIAGNOSIS — E1165 Type 2 diabetes mellitus with hyperglycemia: Secondary | ICD-10-CM | POA: Diagnosis not present

## 2019-11-25 DIAGNOSIS — F339 Major depressive disorder, recurrent, unspecified: Secondary | ICD-10-CM | POA: Diagnosis not present

## 2019-11-25 DIAGNOSIS — J4520 Mild intermittent asthma, uncomplicated: Secondary | ICD-10-CM

## 2019-11-25 DIAGNOSIS — R319 Hematuria, unspecified: Secondary | ICD-10-CM

## 2019-11-25 LAB — POCT URINALYSIS DIPSTICK
Glucose, UA: NEGATIVE
Ketones, UA: NEGATIVE
Leukocytes, UA: NEGATIVE
Nitrite, UA: NEGATIVE
Protein, UA: POSITIVE — AB
Spec Grav, UA: 1.025 (ref 1.010–1.025)
Urobilinogen, UA: NEGATIVE E.U./dL — AB
pH, UA: 6 (ref 5.0–8.0)

## 2019-11-25 MED ORDER — FREESTYLE LIBRE SENSOR SYSTEM MISC: 5 refills | Status: DC

## 2019-11-25 NOTE — Progress Notes (Signed)
[facility] [place], [place] [address]  Internal MEDICINE  Office Visit Note  Patient Name: Rebecca Ponce  [id number]  [id number]  Date of Service: 11/30/2019  Chief Complaint  Patient presents with  . Follow-up    discuss asthma   . Depression  . Diabetes  . Hyperlipidemia    The patient is here for follow up visit. Was started on metformin at last visit due to new diagnosis of type 2 diabetes. She has done well with the addition of this medication. Denies negative side effects. Has been monitoring her blood sugars every morning. States that they are generally running around 100 or in the low 100s.  While hospitalized, she had significant blood in her urine and severe uti. She has completed all antibiotic treatment. She denies painful urination, frequency, or visible blood in the urine.  She is also doing well with asthma. States that she has only had to use her rescue inhaler once or twice since it was initially prescribed,       Current Medication: Outpatient Encounter Medications as of 11/25/2019  Medication Sig  . albuterol (VENTOLIN HFA) 108 (90 Base) MCG/ACT inhaler Inhale 2 puffs into the lungs every 6 (six) hours as needed for wheezing or shortness of breath.  . blood glucose meter kit and supplies KIT Dispense based on patient and insurance preference. Use up to four times daily as directed. (FOR ICD-9 250.00, 250.01).  Marland Kitchen glucose blood (ONETOUCH ULTRA) test strip Use as directed twice daily  . Lancets (ONETOUCH ULTRASOFT) lancets Blood sugar testing is done twice daily  . metFORMIN (GLUCOPHAGE) 500 MG tablet Take 1 tablet (500 mg total) by mouth 2 (two) times daily with a meal.  . PARoxetine (PAXIL) 30 MG tablet Take 1 tablet (30 mg total) by mouth daily.  . Continuous Blood Gluc Sensor (FREESTYLE LIBRE SENSOR SYSTEM) MISC Change sensor every 15 days.   No facility-administered encounter medications on file as of 11/25/2019.    Surgical  History: Past Surgical History:  Procedure Laterality Date  . CESAREAN SECTION     twice  . CESAREAN SECTION    . CESAREAN SECTION WITH BILATERAL TUBAL LIGATION N/A 06/11/2015   Procedure: CESAREAN SECTION WITH BILATERAL TUBAL LIGATION;  Surgeon: Malachy Mood, MD;  Location: [facility] ORS;  Service: Obstetrics;  Laterality: N/A;  . CHOLECYSTECTOMY    . TONSILLECTOMY    . TONSILLECTOMY      Medical History: Past Medical History:  Diagnosis Date  . Anemia   . Depression   . Gestational diabetes   . Hyperlipidemia   . Insomnia     Family History: Family History  Problem Relation Age of Onset  . Anxiety disorder Mother   . Diabetes Mother   . Hypertension Father   . Diabetes Maternal Aunt     Social History   Socioeconomic History  . Marital status: Married    Spouse name: Not on file  . Number of children: Not on file  . Years of education: Not on file  . Highest education level: Not on file  Occupational History  . Not on file  Tobacco Use  . Smoking status: Never Smoker  . Smokeless tobacco: Never Used  . Tobacco comment: years ago  Substance and Sexual Activity  . Alcohol use: Yes    Comment: socially   . Drug use: Never  . Sexual activity: Not on file  Other Topics Concern  . Not on file  Social History Narrative   **  Merged History Encounter **       Social Determinants of Health   Financial Resource Strain:   . Difficulty of Paying Living Expenses:   Food Insecurity:   . Worried About Charity fundraiser in the Last Year:   . Arboriculturist in the Last Year:   Transportation Needs:   . Film/video editor (Medical):   Marland Kitchen Lack of Transportation (Non-Medical):   Physical Activity:   . Days of Exercise per Week:   . Minutes of Exercise per Session:   Stress:   . Feeling of Stress :   Social Connections:   . Frequency of Communication with Friends and Family:   . Frequency of Social Gatherings with Friends and Family:   . Attends Religious  Services:   . Active Member of Clubs or Organizations:   . Attends Archivist Meetings:   Marland Kitchen Marital Status:   Intimate Partner Violence:   . Fear of Current or Ex-Partner:   . Emotionally Abused:   Marland Kitchen Physically Abused:   . Sexually Abused:       Review of Systems  Constitutional: Negative for activity change, chills, fatigue and unexpected weight change.  HENT: Negative for congestion, postnasal drip, rhinorrhea, sneezing and sore throat.   Respiratory: Negative for cough, chest tightness, shortness of breath and wheezing.   Cardiovascular: Negative for chest pain and palpitations.  Gastrointestinal: Negative for abdominal pain, constipation, diarrhea, nausea and vomiting.  Endocrine:       Started metformin '500mg'$  twice daily. Blood sugars are now running in the low 100s. She is tolerating new medication well.   Genitourinary: Negative for dysuria, flank pain, hematuria and urgency.  Musculoskeletal: Negative for arthralgias, back pain, joint swelling and neck pain.  Skin: Negative for rash.  Allergic/Immunologic: Positive for environmental allergies.  Neurological: Negative for dizziness, tremors, numbness and headaches.  Hematological: Negative for adenopathy. Does not bruise/bleed easily.  Psychiatric/Behavioral: Negative for behavioral problems (Depression), sleep disturbance and suicidal ideas. The patient is not nervous/anxious.     Today's Vitals   11/25/19 1109  BP: 131/82  Pulse: 96  Resp: 16  Temp: 98.1 F (36.7 C)  SpO2: 99%  Weight: 182 lb 9.6 oz (82.8 kg)  Height: '4\' 11"'$  (1.499 m)   Body mass index is 36.88 kg/m.  Physical Exam Vitals and nursing note reviewed.  Constitutional:      General: She is not in acute distress.    Appearance: Normal appearance. She is well-developed. She is not diaphoretic.  HENT:     Head: Normocephalic and atraumatic.     Nose: Nose normal.     Mouth/Throat:     Pharynx: No oropharyngeal exudate.  Eyes:      Pupils: Pupils are equal, round, and reactive to light.  Neck:     Thyroid: No thyromegaly.     Vascular: No JVD.     Trachea: No tracheal deviation.  Cardiovascular:     Rate and Rhythm: Normal rate and regular rhythm.     Heart sounds: Normal heart sounds. No murmur. No friction rub. No gallop.   Pulmonary:     Effort: Pulmonary effort is normal. No respiratory distress.     Breath sounds: Normal breath sounds. No wheezing or rales.  Chest:     Chest wall: No tenderness.  Abdominal:     General: Bowel sounds are normal.     Palpations: Abdomen is soft.     Tenderness: There is no abdominal tenderness.  Genitourinary:    Comments: Urine sample positive for some protein and trace blood.  Musculoskeletal:        General: Normal range of motion.     Cervical back: Normal range of motion and neck supple.  Lymphadenopathy:     Cervical: No cervical adenopathy.  Skin:    General: Skin is warm and dry.  Neurological:     Mental Status: She is alert and oriented to person, place, and time.     Cranial Nerves: No cranial nerve deficit.  Psychiatric:        Mood and Affect: Mood normal.        Behavior: Behavior normal.        Thought Content: Thought content normal.        Judgment: Judgment normal.    Assessment/Plan: 1. Type 2 diabetes mellitus with hyperglycemia, without long-term current use of insulin (HCC) Blood sugars have improved since starting metformin 554m twice daily. Freestyle Libre blood glucose monitoring system ordered so she can monitor sugars more closely.  - Continuous Blood Gluc Sensor (FREESTYLE LIBRE SENSOR SYSTEM) MISC; Change sensor every 15 days.  Dispense: 2 each; Refill: 5  2. Hematuria, unspecified type U/a showing some protein and trace blood only. Will send for culture and sensitivity and treat as indicated.  - POCT Urinalysis Dipstick - CULTURE, URINE COMPREHENSIVE  3. Mild intermittent asthma without complication Doing well. Continue to use  inhaler as needed and as prescribed   4. Depression, recurrent ([place] Stable. Continue to take paxil as prescribed   General Counseling: KAnyaverbalizes understanding of the findings of todays visit and agrees with plan of treatment. I have discussed any further diagnostic evaluation that may be needed or ordered today. We also reviewed her medications today. she has been encouraged to call the office with any questions or concerns that should arise related to todays visit.  Diabetes Counseling:  1. Addition of ACE inh/ ARB'S for nephroprotection. Microalbumin is updated  2. Diabetic foot care, prevention of complications. Podiatry consult 3. Exercise and lose weight.  4. Diabetic eye examination, Diabetic eye exam is updated  5. Monitor blood sugar closlely. nutrition counseling.  6. Sign and symptoms of hypoglycemia including shaking sweating,confusion and headaches.  This patient was seen by HLeretha PolFNP Collaboration with Dr FLavera Guiseas a part of collaborative care agreement  Orders Placed This Encounter  Procedures  . CULTURE, URINE COMPREHENSIVE  . POCT Urinalysis Dipstick    Meds ordered this encounter  Medications  . Continuous Blood Gluc Sensor (FREESTYLE LIBRE SENSOR SYSTEM) MISC    Sig: Change sensor every 15 days.    Dispense:  2 each    Refill:  5    Order Specific Question:   Supervising Provider    Answer:   KLavera Guise[[[id number]   Total time spent: 30 Minutes   Time spent includes review of chart, medications, test results, and follow up plan with the patient.      Dr FLavera GuiseInternal medicine

## 2019-11-28 NOTE — Progress Notes (Signed)
No antibiotic treatment provided. Not indicated.

## 2019-11-30 LAB — CULTURE, URINE COMPREHENSIVE

## 2019-12-11 ENCOUNTER — Other Ambulatory Visit: Payer: Self-pay | Admitting: Nurse Practitioner

## 2019-12-11 DIAGNOSIS — F339 Major depressive disorder, recurrent, unspecified: Secondary | ICD-10-CM

## 2020-01-14 ENCOUNTER — Other Ambulatory Visit: Payer: Self-pay | Admitting: Nurse Practitioner

## 2020-01-14 DIAGNOSIS — E1165 Type 2 diabetes mellitus with hyperglycemia: Secondary | ICD-10-CM

## 2020-01-15 NOTE — Telephone Encounter (Signed)
Pt might need change in therapy ( like adding [place] or invokana)

## 2020-01-18 NOTE — Telephone Encounter (Signed)
Ok. I see her back 7/13 for first Hgba1c check since she was diagnosed.

## 2020-01-24 ENCOUNTER — Ambulatory Visit: Payer: Commercial Managed Care - PPO | Admitting: Nurse Practitioner

## 2020-02-02 ENCOUNTER — Telehealth: Payer: Self-pay

## 2020-02-02 NOTE — Telephone Encounter (Signed)
Lmom to confirm and screen for 02-06-20 ov. °

## 2020-02-06 ENCOUNTER — Ambulatory Visit: Payer: Commercial Managed Care - PPO | Admitting: Adult Health

## 2020-02-06 ENCOUNTER — Encounter: Payer: Self-pay | Admitting: Adult Health

## 2020-02-06 ENCOUNTER — Other Ambulatory Visit: Payer: Self-pay

## 2020-02-06 VITALS — BP 138/84 | HR 91 | Temp 98.0°F | Resp 16 | Ht 59.0 in | Wt 177.4 lb

## 2020-02-06 DIAGNOSIS — Z6835 Body mass index (BMI) 35.0-35.9, adult: Secondary | ICD-10-CM

## 2020-02-06 DIAGNOSIS — F339 Major depressive disorder, recurrent, unspecified: Secondary | ICD-10-CM

## 2020-02-06 DIAGNOSIS — J4520 Mild intermittent asthma, uncomplicated: Secondary | ICD-10-CM

## 2020-02-06 DIAGNOSIS — E1165 Type 2 diabetes mellitus with hyperglycemia: Secondary | ICD-10-CM | POA: Diagnosis not present

## 2020-02-06 LAB — POCT GLYCOSYLATED HEMOGLOBIN (HGB A1C): Hemoglobin A1C: 7.3 % — AB (ref 4.0–5.6)

## 2020-02-06 NOTE — Progress Notes (Signed)
[facility] [place], [place] [address]  Internal MEDICINE  Office Visit Note  Patient Name: Rebecca Ponce  [id number]  [id number]  Date of Service: 02/06/2020  Chief Complaint  Patient presents with  . Follow-up    2 month, A1C    HPI  Pt is here for follow up on DM. Her A1C is improved by an entire point to 7.3.  She is doing well using her Elenor Legato, and keeping track of her blood sugars. She takes metformin 567m BID.  She reports her blood sugar has been averaging 119 mg/dl lately.  She denies any new or worse symptoms.    Current Medication: Outpatient Encounter Medications as of 02/06/2020  Medication Sig  . albuterol (VENTOLIN HFA) 108 (90 Base) MCG/ACT inhaler Inhale 2 puffs into the lungs every 6 (six) hours as needed for wheezing or shortness of breath.  . blood glucose meter kit and supplies KIT Dispense based on patient and insurance preference. Use up to four times daily as directed. (FOR ICD-9 250.00, 250.01).  . Continuous Blood Gluc Sensor (FREESTYLE LIBRE SENSOR SYSTEM) MISC Change sensor every 15 days.  . metFORMIN (GLUCOPHAGE) 500 MG tablet TAKE 1 TABLET (500 MG TOTAL) BY MOUTH 2 (TWO) TIMES DAILY WITH A MEAL.  .Marland KitchenPARoxetine (PAXIL) 30 MG tablet TAKE 1 TABLET BY MOUTH EVERY DAY  . [DISCONTINUED] glucose blood (ONETOUCH ULTRA) test strip Use as directed twice daily  . [DISCONTINUED] Lancets (ONETOUCH ULTRASOFT) lancets Blood sugar testing is done twice daily   No facility-administered encounter medications on file as of 02/06/2020.    Surgical History: Past Surgical History:  Procedure Laterality Date  . CESAREAN SECTION     twice  . CESAREAN SECTION    . CESAREAN SECTION WITH BILATERAL TUBAL LIGATION N/A 06/11/2015   Procedure: CESAREAN SECTION WITH BILATERAL TUBAL LIGATION;  Surgeon: AMalachy Mood MD;  Location: [facility] ORS;  Service: Obstetrics;  Laterality: N/A;  . CHOLECYSTECTOMY    . TONSILLECTOMY    . TONSILLECTOMY       Medical History: Past Medical History:  Diagnosis Date  . Anemia   . Depression   . Gestational diabetes   . Hyperlipidemia   . Insomnia     Family History: Family History  Problem Relation Age of Onset  . Anxiety disorder Mother   . Diabetes Mother   . Hypertension Father   . Diabetes Maternal Aunt     Social History   Socioeconomic History  . Marital status: Married    Spouse name: Not on file  . Number of children: Not on file  . Years of education: Not on file  . Highest education level: Not on file  Occupational History  . Not on file  Tobacco Use  . Smoking status: Never Smoker  . Smokeless tobacco: Never Used  . Tobacco comment: years ago  Vaping Use  . Vaping Use: Never used  Substance and Sexual Activity  . Alcohol use: Yes    Comment: socially   . Drug use: Never  . Sexual activity: Not on file  Other Topics Concern  . Not on file  Social History Narrative   ** Merged History Encounter **       Social Determinants of Health   Financial Resource Strain:   . Difficulty of Paying Living Expenses:   Food Insecurity:   . Worried About RCharity fundraiserin the Last Year:   . RBridgeviewin the Last Year:  Transportation Needs:   . Film/video editor (Medical):   Marland Kitchen Lack of Transportation (Non-Medical):   Physical Activity:   . Days of Exercise per Week:   . Minutes of Exercise per Session:   Stress:   . Feeling of Stress :   Social Connections:   . Frequency of Communication with Friends and Family:   . Frequency of Social Gatherings with Friends and Family:   . Attends Religious Services:   . Active Member of Clubs or Organizations:   . Attends Archivist Meetings:   Marland Kitchen Marital Status:   Intimate Partner Violence:   . Fear of Current or Ex-Partner:   . Emotionally Abused:   Marland Kitchen Physically Abused:   . Sexually Abused:       Review of Systems  Constitutional: Negative for chills, fatigue and unexpected weight  change.  HENT: Negative for congestion, rhinorrhea, sneezing and sore throat.   Eyes: Negative for photophobia, pain and redness.  Respiratory: Negative for cough, chest tightness and shortness of breath.   Cardiovascular: Negative for chest pain and palpitations.  Gastrointestinal: Negative for abdominal pain, constipation, diarrhea, nausea and vomiting.  Endocrine: Negative.   Genitourinary: Negative for dysuria and frequency.  Musculoskeletal: Negative for arthralgias, back pain, joint swelling and neck pain.  Skin: Negative for rash.  Allergic/Immunologic: Negative.   Neurological: Negative for tremors and numbness.  Hematological: Negative for adenopathy. Does not bruise/bleed easily.  Psychiatric/Behavioral: Negative for behavioral problems and sleep disturbance. The patient is not nervous/anxious.     Vital Signs: BP (!) 123/93   Pulse 91   Temp 98 F (36.7 C)   Resp 16   Ht 4' 11"  (1.499 m)   Wt 177 lb 6.4 oz (80.5 kg)   SpO2 96%   BMI 35.83 kg/m    Physical Exam Vitals and nursing note reviewed.  Constitutional:      General: She is not in acute distress.    Appearance: She is well-developed. She is not diaphoretic.  HENT:     Head: Normocephalic and atraumatic.     Mouth/Throat:     Pharynx: No oropharyngeal exudate.  Eyes:     Pupils: Pupils are equal, round, and reactive to light.  Neck:     Thyroid: No thyromegaly.     Vascular: No JVD.     Trachea: No tracheal deviation.  Cardiovascular:     Rate and Rhythm: Normal rate and regular rhythm.     Heart sounds: Normal heart sounds. No murmur heard.  No friction rub. No gallop.   Pulmonary:     Effort: Pulmonary effort is normal. No respiratory distress.     Breath sounds: Normal breath sounds. No wheezing or rales.  Chest:     Chest wall: No tenderness.  Abdominal:     Palpations: Abdomen is soft.     Tenderness: There is no abdominal tenderness. There is no guarding.  Musculoskeletal:         General: Normal range of motion.     Cervical back: Normal range of motion and neck supple.  Lymphadenopathy:     Cervical: No cervical adenopathy.  Skin:    General: Skin is warm and dry.  Neurological:     Mental Status: She is alert and oriented to person, place, and time.     Cranial Nerves: No cranial nerve deficit.  Psychiatric:        Behavior: Behavior normal.        Thought Content: Thought content normal.  Judgment: Judgment normal.     Assessment/Plan: 1. Type 2 diabetes mellitus with hyperglycemia, without long-term current use of insulin (HCC) Continue metformin, and lifestyle modifications.  - POCT glycosylated hemoglobin (Hb A1C)  2. Mild intermittent asthma without complication No recent issues, continue to use albuterol as needed.   3. Depression, recurrent (HCC) Continue paxil  4. BMI 35.0-35.9,adult comorbidities include DM, Depression, Asthma.  Obesity Counseling: Risk Assessment: An assessment of behavioral risk factors was made today and includes lack of exercise sedentary lifestyle, lack of portion control and poor dietary habits.  Risk Modification Advice: She was counseled on portion control guidelines. Restricting daily caloric intake to 1600. The detrimental long term effects of obesity on her health and ongoing poor compliance was also discussed with the patient.    General Counseling: Rebecca Ponce verbalizes understanding of the findings of todays visit and agrees with plan of treatment. I have discussed any further diagnostic evaluation that may be needed or ordered today. We also reviewed her medications today. she has been encouraged to call the office with any questions or concerns that should arise related to todays visit.    Orders Placed This Encounter  Procedures  . POCT glycosylated hemoglobin (Hb A1C)    No orders of the defined types were placed in this encounter.   Time spent: 30 Minutes   This patient was seen by Orson Gear AGNP-C in Collaboration with Dr Lavera Guise as a part of collaborative care agreement     Kendell Bane AGNP-C Internal medicine

## 2020-04-02 ENCOUNTER — Ambulatory Visit: Payer: Commercial Managed Care - PPO | Admitting: Internal Medicine

## 2020-04-02 ENCOUNTER — Encounter: Payer: Self-pay | Admitting: Internal Medicine

## 2020-04-02 VITALS — Resp 16 | Ht 59.0 in | Wt 176.0 lb

## 2020-04-02 DIAGNOSIS — R300 Dysuria: Secondary | ICD-10-CM

## 2020-04-02 DIAGNOSIS — R05 Cough: Secondary | ICD-10-CM

## 2020-04-02 DIAGNOSIS — R059 Cough, unspecified: Secondary | ICD-10-CM

## 2020-04-02 MED ORDER — LEVOFLOXACIN 500 MG PO TABS: 500.0000 mg | ORAL_TABLET | Freq: Every day | ORAL | 0 refills | Status: DC

## 2020-04-02 MED ORDER — ALBUTEROL SULFATE HFA 108 (90 BASE) MCG/ACT IN AERS: INHALATION_SPRAY | RESPIRATORY_TRACT | 0 refills | Status: DC

## 2020-04-02 NOTE — Progress Notes (Signed)
[facility] [place], [place] [address]  Internal MEDICINE  Telephone Visit  Patient Name: Rebecca Ponce  [id number]  [id number]  Date of Service: 04/03/2020  I connected with the patient at 1220 pm  by telephone and verified the patients identity using two identifiers.   I discussed the limitations, risks, security and privacy concerns of performing an evaluation and management service by telephone and the availability of in person appointments. I also discussed with the patient that there may be a patient responsible charge related to the service.  The patient expressed understanding and agrees to proceed.    Chief Complaint  Patient presents with  . Acute Visit    UTI, irration, burning, urgency, voiding only a little, frequency  . Telephone Assessment    [phone]   . Telephone Screen    phone call  . Depression  . Diabetes  . Hyperlipidemia  . Quality Metric Gaps    HepC, TDAP, flu shot    HPI Pt is connected via virtual visit due to c/o  1. Burning and urgency of urine, denies any fever or chills 2. C/o cough and wheezing. Denies any covid exposure  3. Diabetes is under good control   Current Medication: Outpatient Encounter Medications as of 04/02/2020  Medication Sig  . blood glucose meter kit and supplies KIT Dispense based on patient and insurance preference. Use up to four times daily as directed. (FOR ICD-9 250.00, 250.01).  . Continuous Blood Gluc Sensor (FREESTYLE LIBRE SENSOR SYSTEM) MISC Change sensor every 15 days.  . metFORMIN (GLUCOPHAGE) 500 MG tablet TAKE 1 TABLET (500 MG TOTAL) BY MOUTH 2 (TWO) TIMES DAILY WITH A MEAL.  Marland Kitchen PARoxetine (PAXIL) 30 MG tablet TAKE 1 TABLET BY MOUTH EVERY DAY  . [DISCONTINUED] albuterol (VENTOLIN HFA) 108 (90 Base) MCG/ACT inhaler Inhale 2 puffs into the lungs every 6 (six) hours as needed for wheezing or shortness of breath.  Marland Kitchen albuterol (VENTOLIN HFA) 108 (90 Base) MCG/ACT inhaler 2 puff bid prn for  cough and wheezing  . levofloxacin (LEVAQUIN) 500 MG tablet Take 1 tablet (500 mg total) by mouth daily.   No facility-administered encounter medications on file as of 04/02/2020.    Surgical History: Past Surgical History:  Procedure Laterality Date  . CESAREAN SECTION     twice  . CESAREAN SECTION    . CESAREAN SECTION WITH BILATERAL TUBAL LIGATION N/A 06/11/2015   Procedure: CESAREAN SECTION WITH BILATERAL TUBAL LIGATION;  Surgeon: Malachy Mood, MD;  Location: [facility] ORS;  Service: Obstetrics;  Laterality: N/A;  . CHOLECYSTECTOMY    . TONSILLECTOMY    . TONSILLECTOMY      Medical History: Past Medical History:  Diagnosis Date  . Anemia   . Depression   . Gestational diabetes   . Hyperlipidemia   . Insomnia     Family History: Family History  Problem Relation Age of Onset  . Anxiety disorder Mother   . Diabetes Mother   . Hypertension Father   . Diabetes Maternal Aunt     Social History   Socioeconomic History  . Marital status: Married    Spouse name: Not on file  . Number of children: Not on file  . Years of education: Not on file  . Highest education level: Not on file  Occupational History  . Not on file  Tobacco Use  . Smoking status: Never Smoker  . Smokeless tobacco: Never Used  . Tobacco comment: years ago  Vaping Use  .  Vaping Use: Never used  Substance and Sexual Activity  . Alcohol use: Yes    Comment: socially   . Drug use: Never  . Sexual activity: Not on file  Other Topics Concern  . Not on file  Social History Narrative   ** Merged History Encounter **       Social Determinants of Health   Financial Resource Strain:   . Difficulty of Paying Living Expenses: Not on file  Food Insecurity:   . Worried About Charity fundraiser in the Last Year: Not on file  . Ran Out of Food in the Last Year: Not on file  Transportation Needs:   . Lack of Transportation (Medical): Not on file  . Lack of Transportation (Non-Medical): Not on file   Physical Activity:   . Days of Exercise per Week: Not on file  . Minutes of Exercise per Session: Not on file  Stress:   . Feeling of Stress : Not on file  Social Connections:   . Frequency of Communication with Friends and Family: Not on file  . Frequency of Social Gatherings with Friends and Family: Not on file  . Attends Religious Services: Not on file  . Active Member of Clubs or Organizations: Not on file  . Attends Archivist Meetings: Not on file  . Marital Status: Not on file  Intimate Partner Violence:   . Fear of Current or Ex-Partner: Not on file  . Emotionally Abused: Not on file  . Physically Abused: Not on file  . Sexually Abused: Not on file      Review of Systems  Constitutional: Negative for fatigue and fever.  Respiratory: Positive for cough.   Cardiovascular: Negative.   Gastrointestinal: Negative.   Genitourinary: Positive for dysuria and frequency.    Vital Signs: Resp 16   Ht _0  (1.499 m)   Wt 176 lb (79.8 kg)   BMI 35.55 kg/m    Observation/Objective: No acute distress, exam is restricted due to virtual visiy   Assessment/Plan: 1. Dysuria She will need to be seen in the office for urine specimen however will treat empirically for now  - levofloxacin (LEVAQUIN) 500 MG tablet; Take 1 tablet (500 mg total) by mouth daily.  Dispense: 7 tablet; Refill: 0  2. Cough Will monitor her symptoms  - albuterol (VENTOLIN HFA) 108 (90 Base) MCG/ACT inhaler; 2 puff bid prn for cough and wheezing  Dispense: 8 g; Refill: 0  General Counseling: [name] verbalizes understanding of the findings of today's phone visit and agrees with plan of treatment. I have discussed any further diagnostic evaluation that may be needed or ordered today. We also reviewed her medications today. she has been encouraged to call the office with any questions or concerns that should arise related to todays visit.    Meds ordered this encounter  Medications  .  levofloxacin (LEVAQUIN) 500 MG tablet    Sig: Take 1 tablet (500 mg total) by mouth daily.    Dispense:  7 tablet    Refill:  0  . albuterol (VENTOLIN HFA) 108 (90 Base) MCG/ACT inhaler    Sig: 2 puff bid prn for cough and wheezing    Dispense:  8 g    Refill:  0    Time spent:15 Minutes    Dr Lavera Guise Internal medicine

## 2020-04-16 ENCOUNTER — Encounter: Payer: Commercial Managed Care - PPO | Admitting: Nurse Practitioner

## 2020-04-21 ENCOUNTER — Other Ambulatory Visit: Payer: Self-pay | Admitting: Internal Medicine

## 2020-04-21 DIAGNOSIS — R059 Cough, unspecified: Secondary | ICD-10-CM

## 2020-04-26 ENCOUNTER — Other Ambulatory Visit: Payer: Self-pay

## 2020-04-26 DIAGNOSIS — E1165 Type 2 diabetes mellitus with hyperglycemia: Secondary | ICD-10-CM

## 2020-04-26 MED ORDER — METFORMIN HCL 500 MG PO TABS: 500.0000 mg | ORAL_TABLET | Freq: Two times a day (BID) | ORAL | 1 refills | Status: DC

## 2020-05-08 ENCOUNTER — Ambulatory Visit: Payer: Commercial Managed Care - PPO | Admitting: Internal Medicine

## 2020-05-08 ENCOUNTER — Other Ambulatory Visit: Payer: Self-pay

## 2020-05-08 ENCOUNTER — Encounter: Payer: Self-pay | Admitting: Internal Medicine

## 2020-05-08 VITALS — BP 114/84 | HR 96 | Temp 97.9°F | Resp 16 | Ht 59.0 in | Wt 180.0 lb

## 2020-05-08 DIAGNOSIS — Z6837 Body mass index (BMI) 37.0-37.9, adult: Secondary | ICD-10-CM

## 2020-05-08 DIAGNOSIS — R300 Dysuria: Secondary | ICD-10-CM | POA: Diagnosis not present

## 2020-05-08 DIAGNOSIS — E1165 Type 2 diabetes mellitus with hyperglycemia: Secondary | ICD-10-CM

## 2020-05-08 DIAGNOSIS — N3000 Acute cystitis without hematuria: Secondary | ICD-10-CM | POA: Diagnosis not present

## 2020-05-08 LAB — POCT GLYCOSYLATED HEMOGLOBIN (HGB A1C): Hemoglobin A1C: 7.6 % — AB (ref 4.0–5.6)

## 2020-05-08 LAB — POCT URINALYSIS DIPSTICK
Bilirubin, UA: NEGATIVE
Glucose, UA: NEGATIVE
Ketones, UA: NEGATIVE
Leukocytes, UA: NEGATIVE
Nitrite, UA: NEGATIVE
Protein, UA: NEGATIVE
Spec Grav, UA: 1.015 (ref 1.010–1.025)
Urobilinogen, UA: 0.2 E.U./dL
pH, UA: 5 (ref 5.0–8.0)

## 2020-05-08 NOTE — Progress Notes (Signed)
[facility] [place], [place] [address]  Internal MEDICINE  Office Visit Note  Patient Name: Rebecca Ponce  [id number]  [id number]  Date of Service: 05/10/2020  Chief Complaint  Patient presents with  . Follow-up    3 month, A1C  . controlled substance policy    acknowledged  . Quality Metric Gaps    Hep C, Tdap    HPI Pt is here for routine follow up. She was treated for UTI with Levaquin, she also had acute bronchitis, both symptoms have improved, finished Levaquin but ha albuterol, Her blood sugar has been elevated, she is on metformin, does not watch her diet very well  She has 3 young kids home. Do eat out    Current Medication: Outpatient Encounter Medications as of 05/08/2020  Medication Sig  . albuterol (VENTOLIN HFA) 108 (90 Base) MCG/ACT inhaler 2 PUFF TWICE A DAY AS NEEDED FOR COUGH AND WHEEZING  . blood glucose meter kit and supplies KIT Dispense based on patient and insurance preference. Use up to four times daily as directed. (FOR ICD-9 250.00, 250.01).  . Continuous Blood Gluc Sensor (FREESTYLE LIBRE SENSOR SYSTEM) MISC Change sensor every 15 days.  Marland Kitchen levofloxacin (LEVAQUIN) 500 MG tablet Take 1 tablet (500 mg total) by mouth daily.  . metFORMIN (GLUCOPHAGE) 500 MG tablet Take 1 tablet (500 mg total) by mouth 2 (two) times daily with a meal.  . PARoxetine (PAXIL) 30 MG tablet TAKE 1 TABLET BY MOUTH EVERY DAY  . dapagliflozin propanediol (FARXIGA) 10 MG TABS tablet Take one tab po qd for diabetes   No facility-administered encounter medications on file as of 05/08/2020.    Surgical History: Past Surgical History:  Procedure Laterality Date  . CESAREAN SECTION     twice  . CESAREAN SECTION    . CESAREAN SECTION WITH BILATERAL TUBAL LIGATION N/A 06/11/2015   Procedure: CESAREAN SECTION WITH BILATERAL TUBAL LIGATION;  Surgeon: Malachy Mood, MD;  Location: [facility] ORS;  Service: Obstetrics;  Laterality: N/A;  . CHOLECYSTECTOMY    .  TONSILLECTOMY    . TONSILLECTOMY      Medical History: Past Medical History:  Diagnosis Date  . Anemia   . Depression   . Gestational diabetes   . Hyperlipidemia   . Insomnia     Family History: Family History  Problem Relation Age of Onset  . Anxiety disorder Mother   . Diabetes Mother   . Hypertension Father   . Diabetes Maternal Aunt     Social History   Socioeconomic History  . Marital status: Married    Spouse name: Not on file  . Number of children: Not on file  . Years of education: Not on file  . Highest education level: Not on file  Occupational History  . Not on file  Tobacco Use  . Smoking status: Never Smoker  . Smokeless tobacco: Never Used  . Tobacco comment: years ago  Vaping Use  . Vaping Use: Never used  Substance and Sexual Activity  . Alcohol use: Yes    Comment: socially   . Drug use: Never  . Sexual activity: Not on file  Other Topics Concern  . Not on file  Social History Narrative   ** Merged History Encounter **       Social Determinants of Health   Financial Resource Strain:   . Difficulty of Paying Living Expenses: Not on file  Food Insecurity:   . Worried About Charity fundraiser in the  Last Year: Not on file  . Ran Out of Food in the Last Year: Not on file  Transportation Needs:   . Lack of Transportation (Medical): Not on file  . Lack of Transportation (Non-Medical): Not on file  Physical Activity:   . Days of Exercise per Week: Not on file  . Minutes of Exercise per Session: Not on file  Stress:   . Feeling of Stress : Not on file  Social Connections:   . Frequency of Communication with Friends and Family: Not on file  . Frequency of Social Gatherings with Friends and Family: Not on file  . Attends Religious Services: Not on file  . Active Member of Clubs or Organizations: Not on file  . Attends Archivist Meetings: Not on file  . Marital Status: Not on file  Intimate Partner Violence:   . Fear of  Current or Ex-Partner: Not on file  . Emotionally Abused: Not on file  . Physically Abused: Not on file  . Sexually Abused: Not on file      Review of Systems  Constitutional: Negative for chills, diaphoresis and fatigue.  HENT: Negative for ear pain, postnasal drip and sinus pressure.   Eyes: Negative for photophobia, discharge, redness, itching and visual disturbance.  Respiratory: Positive for cough. Negative for shortness of breath and wheezing.   Cardiovascular: Negative for chest pain, palpitations and leg swelling.  Gastrointestinal: Negative for abdominal pain, constipation, diarrhea, nausea and vomiting.  Genitourinary: Negative for dysuria and flank pain.  Musculoskeletal: Negative for arthralgias, back pain, gait problem and neck pain.  Skin: Negative for color change.  Allergic/Immunologic: Negative for environmental allergies and food allergies.  Neurological: Negative for dizziness and headaches.  Hematological: Does not bruise/bleed easily.  Psychiatric/Behavioral: Negative for agitation, behavioral problems (depression) and hallucinations.    Vital Signs: BP 114/84   Pulse 96   Temp 97.9 F (36.6 C)   Resp 16   Ht 4' 11"  (1.499 m)   Wt 180 lb (81.6 kg)   SpO2 96%   BMI 36.36 kg/m    Physical Exam Constitutional:      General: She is not in acute distress.    Appearance: She is well-developed. She is not diaphoretic.  HENT:     Head: Normocephalic and atraumatic.     Mouth/Throat:     Pharynx: No oropharyngeal exudate.  Eyes:     Pupils: Pupils are equal, round, and reactive to light.  Neck:     Thyroid: No thyromegaly.     Vascular: No JVD.     Trachea: No tracheal deviation.  Cardiovascular:     Rate and Rhythm: Normal rate and regular rhythm.     Heart sounds: Normal heart sounds. No murmur heard.  No friction rub. No gallop.   Pulmonary:     Effort: Pulmonary effort is normal. No respiratory distress.     Breath sounds: No wheezing or rales.   Chest:     Chest wall: No tenderness.  Abdominal:     General: Bowel sounds are normal.     Palpations: Abdomen is soft.  Musculoskeletal:        General: Normal range of motion.     Cervical back: Normal range of motion and neck supple.  Lymphadenopathy:     Cervical: No cervical adenopathy.  Skin:    General: Skin is warm and dry.  Neurological:     Mental Status: She is alert and oriented to person, place, and time.  Cranial Nerves: No cranial nerve deficit.  Psychiatric:        Behavior: Behavior normal.        Thought Content: Thought content normal.        Judgment: Judgment normal.    Assessment/Plan: 1. Type 2 diabetes mellitus with hyperglycemia, without long-term current use of insulin (HCC) Will stop metformin and start farxiga - POCT glycosylated hemoglobin (Hb A1C) - dapagliflozin propanediol (FARXIGA) 10 MG TABS tablet; Take one tab po qd for diabetes  Dispense: 30 tablet; Refill: 2  2. Acute cystitis without hematuria Symptoms have improved, will send urine for cx  - POCT Urinalysis Dipstick - CULTURE, URINE COMPREHENSIVE  3. BMI 37.0-37.9, adult Pt will continue to monitor her diet, restricted calories   General Counseling: Azia verbalizes understanding of the findings of todays visit and agrees with plan of treatment. I have discussed any further diagnostic evaluation that may be needed or ordered today. We also reviewed her medications today. she has been encouraged to call the office with any questions or concerns that should arise related to todays visit. Meds ordered this encounter  Medications  . dapagliflozin propanediol (FARXIGA) 10 MG TABS tablet    Sig: Take one tab po qd for diabetes    Dispense:  30 tablet    Refill:  2   Orders Placed This Encounter  Procedures  . CULTURE, URINE COMPREHENSIVE  . POCT glycosylated hemoglobin (Hb A1C)  . POCT Urinalysis Dipstick   Total time spent:30 Minutes Time spent includes review of chart,  medications, test results, and follow up plan with the patient.   Dr Lavera Guise Internal medicine

## 2020-05-10 MED ORDER — DAPAGLIFLOZIN PROPANEDIOL 10 MG PO TABS: ORAL_TABLET | ORAL | 2 refills | Status: DC

## 2020-05-11 LAB — CULTURE, URINE COMPREHENSIVE

## 2020-05-14 ENCOUNTER — Encounter: Payer: Self-pay | Admitting: Internal Medicine

## 2020-05-14 ENCOUNTER — Encounter: Payer: Self-pay | Admitting: Nurse Practitioner

## 2020-05-28 ENCOUNTER — Encounter: Payer: Self-pay | Admitting: Nurse Practitioner

## 2020-05-28 ENCOUNTER — Other Ambulatory Visit: Payer: Self-pay

## 2020-05-28 ENCOUNTER — Ambulatory Visit: Payer: Commercial Managed Care - PPO | Admitting: Nurse Practitioner

## 2020-05-28 VITALS — BP 84/70 | HR 111 | Temp 98.9°F | Resp 16 | Ht 59.0 in | Wt 175.6 lb

## 2020-06-19 ENCOUNTER — Ambulatory Visit: Payer: Commercial Managed Care - PPO | Admitting: Internal Medicine

## 2020-06-24 IMAGING — CT CT ABD-PELV W/ CM
2 of 4 series · 17 of 46 positions shown, 19 images · IV contrast (APPLIED)
Comparison: 11/25/2011 unenhanced CT abdomen/pelvis.

CLINICAL DATA: Inpatient. Hematuria for 2 days. Recent UTI. Mid to
lower abdominal pain today. Nausea vomiting. Prior tubal ligation.

EXAM:
CT ABDOMEN AND PELVIS WITH CONTRAST
TECHNIQUE: Multidetector CT imaging of the abdomen and pelvis was performed
using the standard protocol following bolus administration of
intravenous contrast.
CONTRAST:  100mL OMNIPAQUE IOHEXOL 300 MG/ML  SOLN

[Series 2: routine abd/pel with · axial · 0.81mm/px · z∈[-537,-117]mm · 14 of 93 slices shown, 16 images]
[im 5/93  soft-tissue]
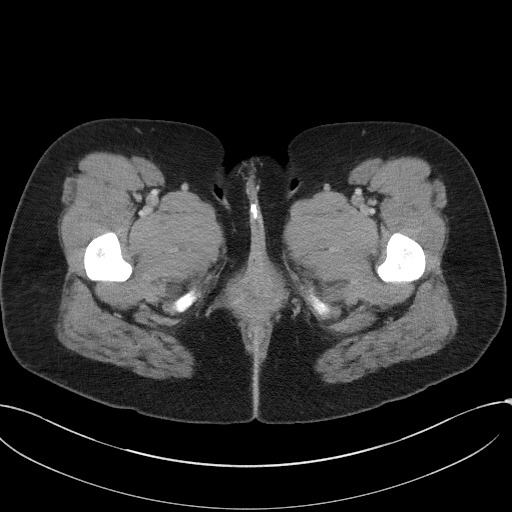
[im 5/93  bone]
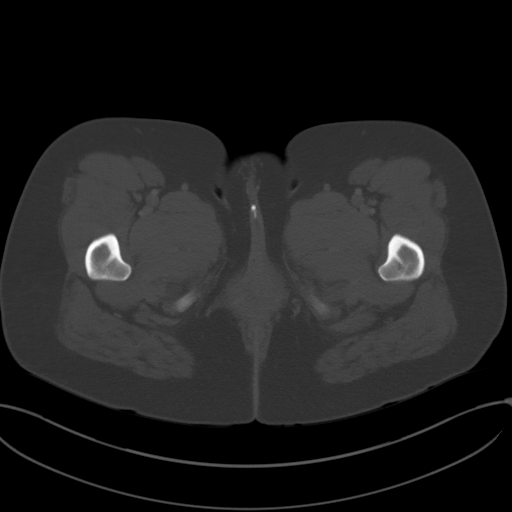
[im 13/93  soft-tissue]
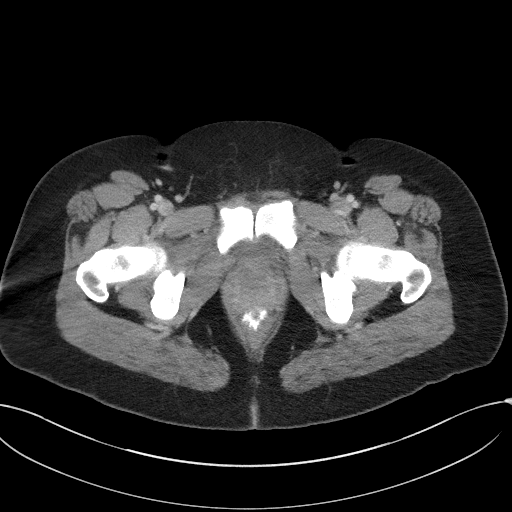
[im 17/93  soft-tissue]
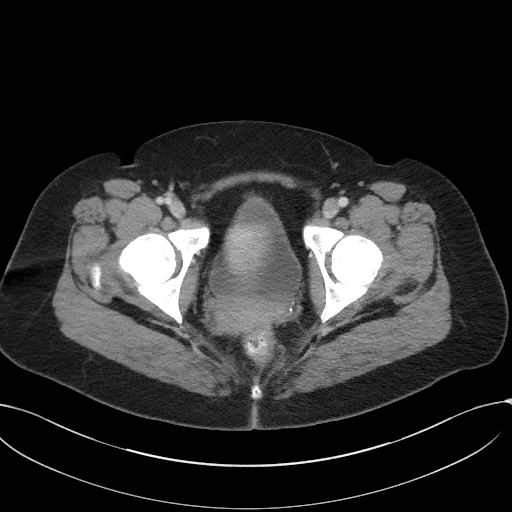
[im 25/93  soft-tissue]
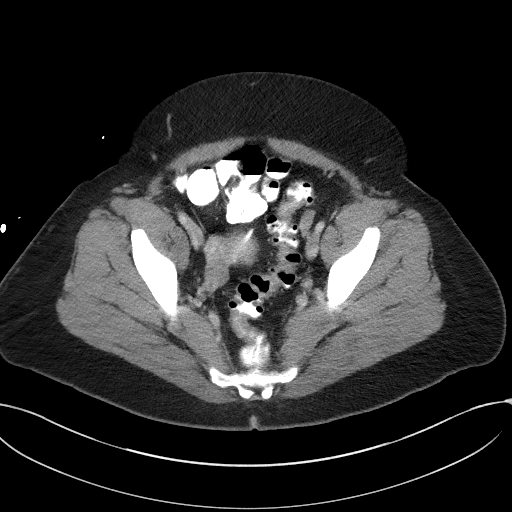
[im 33/93  soft-tissue]
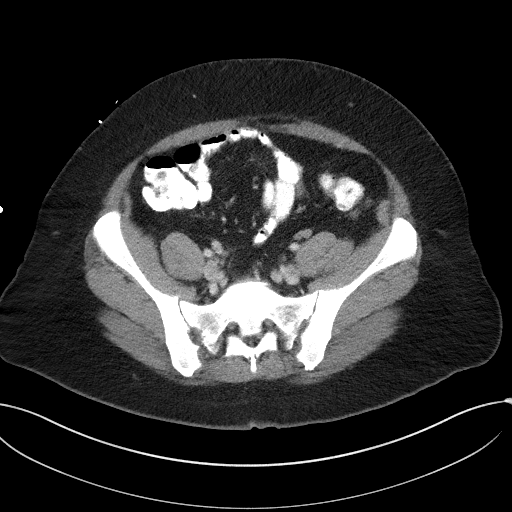
[im 37/93  soft-tissue]
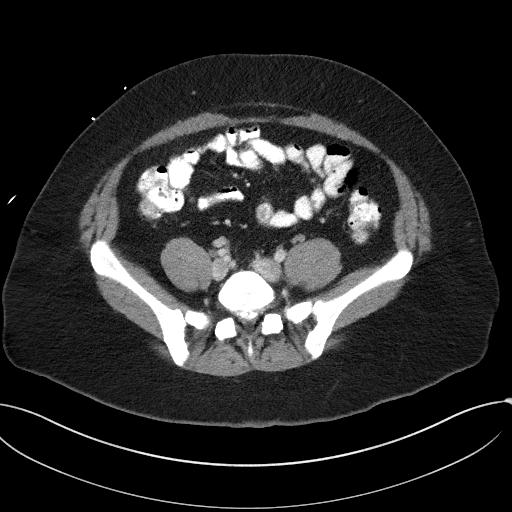
[im 45/93  soft-tissue]
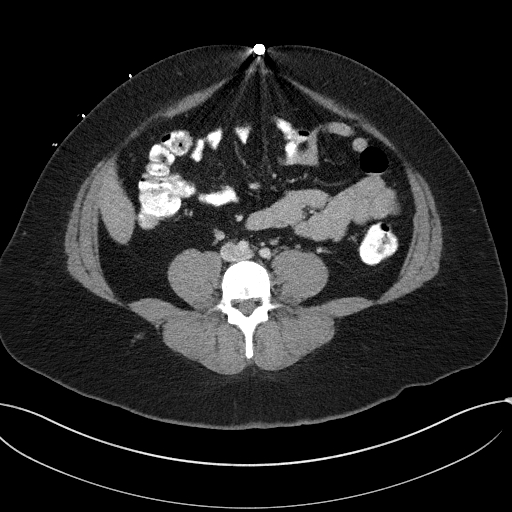
[im 49/93  soft-tissue]
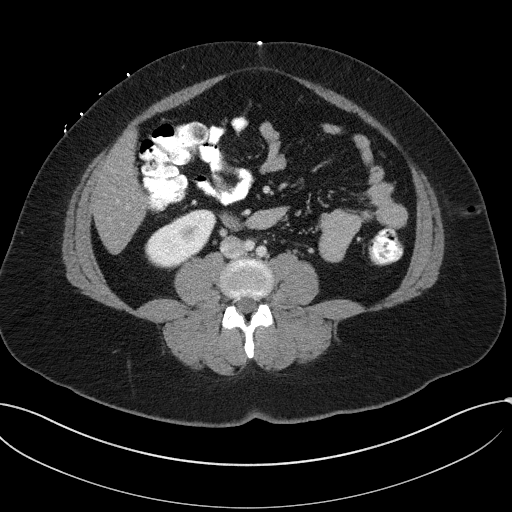
[im 57/93  soft-tissue]
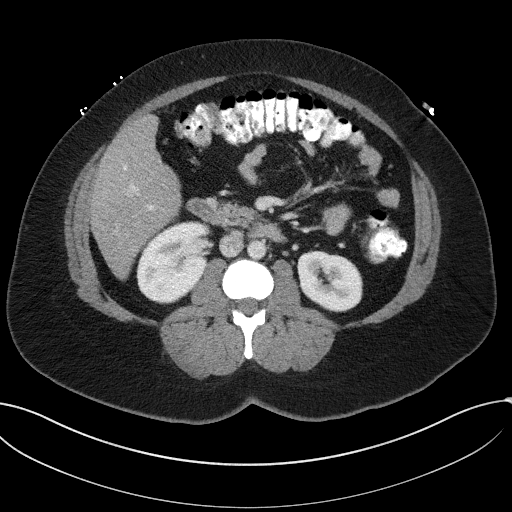
[im 57/93  bone]
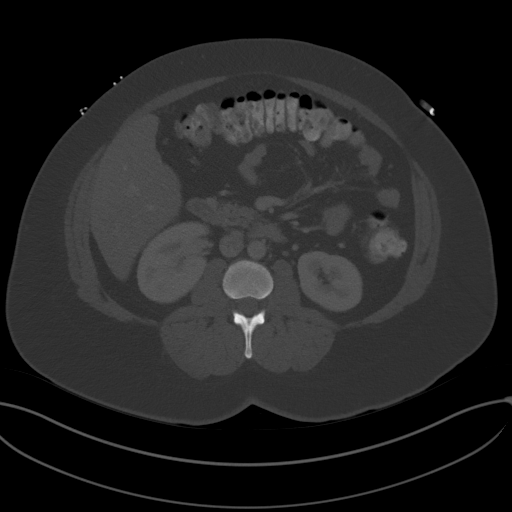
[im 61/93  soft-tissue]
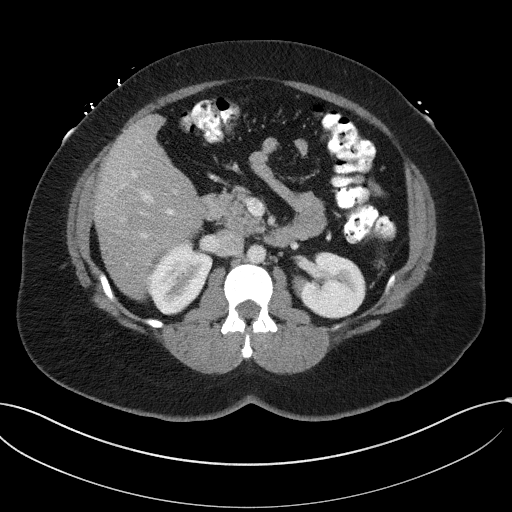
[im 69/93  soft-tissue]
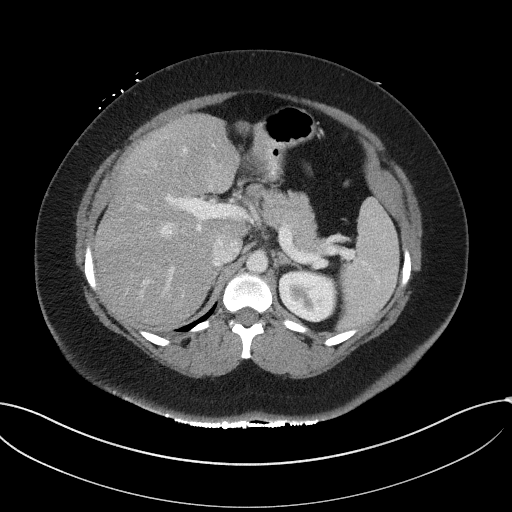
[im 77/93  soft-tissue]
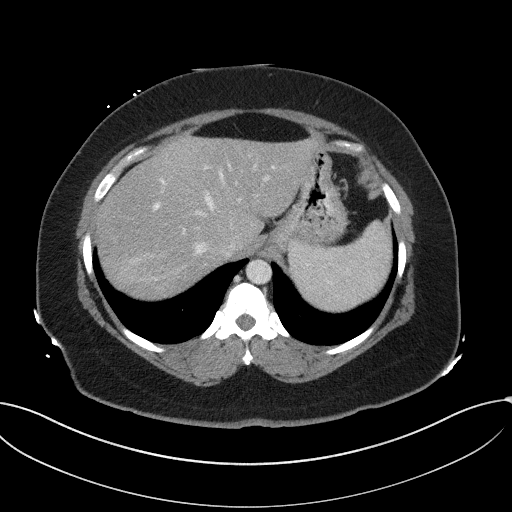
[im 81/93  soft-tissue]
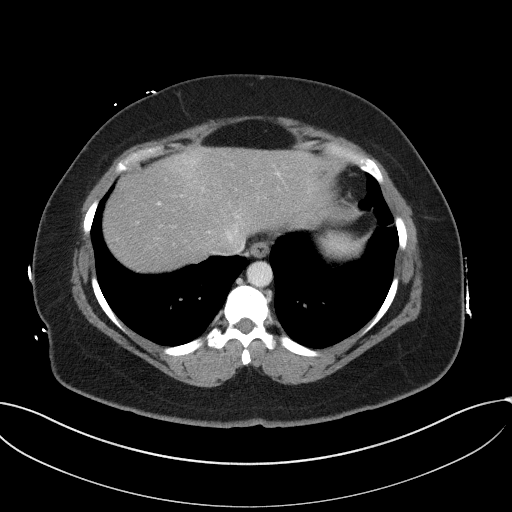
[im 89/93  soft-tissue]
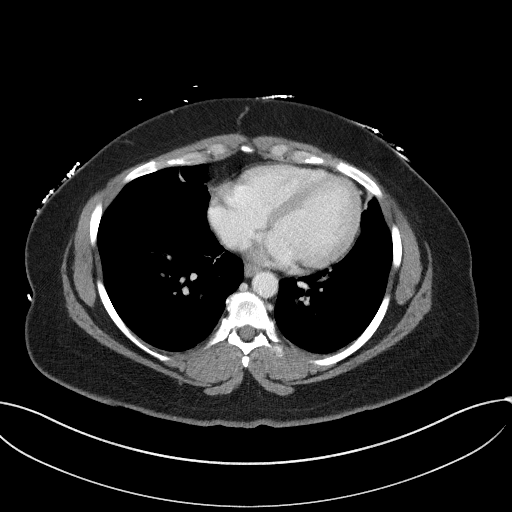

[Series 5: coronal st · coronal · 0.87mm/px · 3 of 104 slices shown]
[im 35/104  soft-tissue]
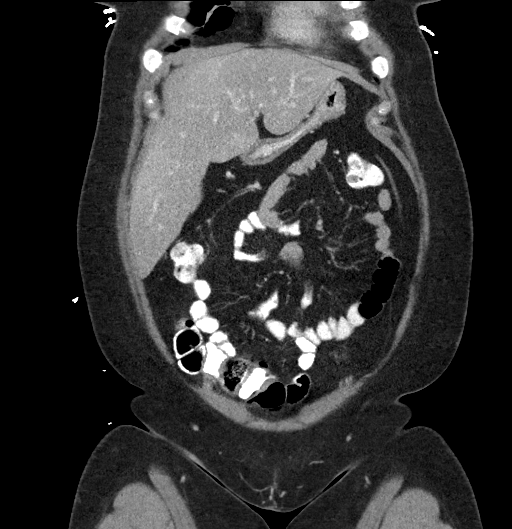
[im 46/104  soft-tissue]
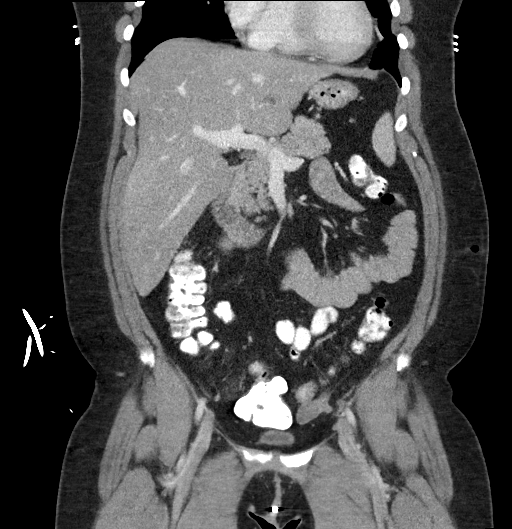
[im 58/104  soft-tissue]
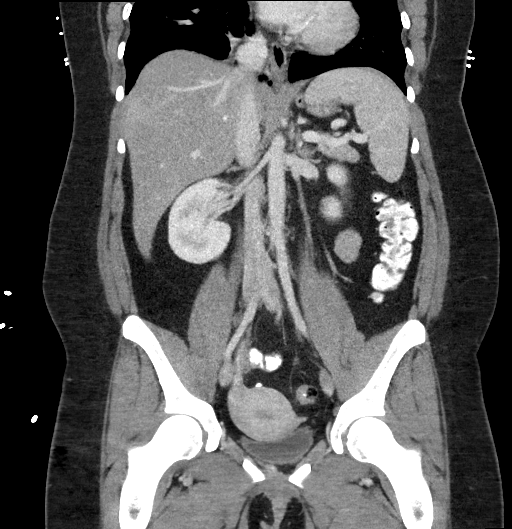

[17 of 46 positions shown; findings below may reference images not displayed]

FINDINGS: Lower chest: No significant pulmonary nodules or acute consolidative
airspace disease.

Hepatobiliary: Suggestion of diffuse hepatic steatosis. No definite
liver surface irregularity. No liver masses. Cholecystectomy. No
biliary ductal dilatation.

Pancreas: Normal, with no mass or duct dilation.

Spleen: Normal size. No mass.

Adrenals/Urinary Tract: Normal adrenals. Normal kidneys with no
hydronephrosis and no renal mass. Normal bladder.

Stomach/Bowel: Normal non-distended stomach. Normal caliber small
bowel with no small bowel wall thickening. Normal appendix. Oral
contrast transits to the rectum. Normal large bowel with no
diverticulosis, large bowel wall thickening or pericolonic fat
stranding.

Vascular/Lymphatic: Normal caliber abdominal aorta. Patent portal,
splenic, hepatic and renal veins. No pathologically enlarged lymph
nodes in the abdomen or pelvis.

Reproductive: Grossly normal uterus.  No adnexal mass.

Other: No pneumoperitoneum, ascites or focal fluid collection.

Musculoskeletal: No aggressive appearing focal osseous lesions. Mild
degenerative disc disease at L5-S1.
IMPRESSION: 1. No acute abnormality. Unremarkable kidneys and bladder on this
routine CT study. If the patient's hematuria persists, a dedicated
hematuria protocol CT abdomen/pelvis without and with IV contrast
could be considered.
2. Suggestion of diffuse hepatic steatosis.

## 2020-07-10 ENCOUNTER — Other Ambulatory Visit: Payer: Commercial Managed Care - PPO | Admitting: Nurse Practitioner

## 2020-07-25 ENCOUNTER — Ambulatory Visit (INDEPENDENT_AMBULATORY_CARE_PROVIDER_SITE_OTHER): Payer: Commercial Managed Care - PPO | Admitting: Hospice and Palliative Medicine

## 2020-07-25 ENCOUNTER — Other Ambulatory Visit: Payer: Self-pay

## 2020-07-25 ENCOUNTER — Telehealth: Payer: Self-pay

## 2020-07-25 ENCOUNTER — Encounter: Payer: Self-pay | Admitting: Hospice and Palliative Medicine

## 2020-07-25 VITALS — BP 99/72 | HR 115 | Temp 97.7°F | Resp 16 | Ht 59.0 in | Wt 171.2 lb

## 2020-07-25 DIAGNOSIS — Z0001 Encounter for general adult medical examination with abnormal findings: Secondary | ICD-10-CM

## 2020-07-25 DIAGNOSIS — Z124 Encounter for screening for malignant neoplasm of cervix: Secondary | ICD-10-CM

## 2020-07-25 DIAGNOSIS — G479 Sleep disorder, unspecified: Secondary | ICD-10-CM

## 2020-07-25 DIAGNOSIS — R45851 Suicidal ideations: Secondary | ICD-10-CM

## 2020-07-25 DIAGNOSIS — E1165 Type 2 diabetes mellitus with hyperglycemia: Secondary | ICD-10-CM

## 2020-07-25 DIAGNOSIS — F332 Major depressive disorder, recurrent severe without psychotic features: Secondary | ICD-10-CM | POA: Diagnosis not present

## 2020-07-25 DIAGNOSIS — R300 Dysuria: Secondary | ICD-10-CM

## 2020-07-25 LAB — POCT GLYCOSYLATED HEMOGLOBIN (HGB A1C): Hemoglobin A1C: 8.6 % — AB (ref 4.0–5.6)

## 2020-07-25 MED ORDER — SERTRALINE HCL 50 MG PO TABS: 50.0000 mg | ORAL_TABLET | Freq: Every day | ORAL | 3 refills | Status: DC

## 2020-07-25 NOTE — Telephone Encounter (Signed)
Patient did not come to check out after appt, unable to reach pt to schedule follow up. Rebecca Ponce

## 2020-07-25 NOTE — Progress Notes (Signed)
[facility] [place], [place] [address]  Internal MEDICINE  Office Visit Note  Patient Name: Rebecca Ponce  [id number]  [id number]  Date of Service: 07/25/2020  Chief Complaint  Patient presents with  . Depression  . Hyperlipidemia  . Diabetes     HPI Pt is here for routine health maintenance examination As she reports, "things have not been going well, she does not want to share specifically what she means by this"  DM-She has not been taking her medications and does not routinely monitor her glucose levels Depression/Anxiety-Has also stopped taking her Paxil as she feels that it had stopped helping control her symptoms  She was tearful throughout the exam--gently approached the topic of what was upsetting her, she was hesitant to open up to me, she finally opened up and became very emotional She explained things are not going well in her life, her marriage is a mess, overwhelmed by having young children and working full-time from [employer] during this conversation Assessed suicidal ideation--again she became very upset, she reports, "I have been having thought of harming myself and ending my life for about 2 weeks." I proceeded to ask if she had a plan for suicide. She then explained, "I have already attempted suicide recently." She then proceeded to pull up the sleeve of her right shirt, there was evidence of cutting, about a 7 inch cut vertically above her wrist with two smaller about 2-3 inches horizontally near wrist She says, "I cut myself with hopes to end my life last week." I offered support and explained legally as she is actively suicidal and recent attempt I would get her inpatient treatment I stepped out of the room to discuss with Dr. Humphrey Rolls and to call the hospital to inform them When I returned to the room, she had left the office Police were called  Current Medication: Outpatient Encounter Medications as of 07/25/2020  Medication Sig  .  albuterol (VENTOLIN HFA) 108 (90 Base) MCG/ACT inhaler 2 PUFF TWICE A DAY AS NEEDED FOR COUGH AND WHEEZING  . blood glucose meter kit and supplies KIT Dispense based on patient and insurance preference. Use up to four times daily as directed. (FOR ICD-9 250.00, 250.01).  . Continuous Blood Gluc Sensor (FREESTYLE LIBRE SENSOR SYSTEM) MISC Change sensor every 15 days.  . dapagliflozin propanediol (FARXIGA) 10 MG TABS tablet Take one tab po qd for diabetes  . levofloxacin (LEVAQUIN) 500 MG tablet Take 1 tablet (500 mg total) by mouth daily.  . sertraline (ZOLOFT) 50 MG tablet Take 1 tablet (50 mg total) by mouth daily.  . [DISCONTINUED] PARoxetine (PAXIL) 30 MG tablet TAKE 1 TABLET BY MOUTH EVERY DAY   No facility-administered encounter medications on file as of 07/25/2020.    Surgical History: Past Surgical History:  Procedure Laterality Date  . CESAREAN SECTION     twice  . CESAREAN SECTION    . CESAREAN SECTION WITH BILATERAL TUBAL LIGATION N/A 06/11/2015   Procedure: CESAREAN SECTION WITH BILATERAL TUBAL LIGATION;  Surgeon: Malachy Mood, MD;  Location: [facility] ORS;  Service: Obstetrics;  Laterality: N/A;  . CHOLECYSTECTOMY    . TONSILLECTOMY    . TONSILLECTOMY      Medical History: Past Medical History:  Diagnosis Date  . Anemia   . Depression   . Gestational diabetes   . Hyperlipidemia   . Insomnia     Family History: Family History  Problem Relation Age of Onset  . Anxiety disorder Mother   .  Diabetes Mother   . Hypertension Father   . Diabetes Maternal Aunt       Review of Systems  Constitutional: Negative for chills, diaphoresis and fatigue.  HENT: Negative for ear pain, postnasal drip and sinus pressure.   Eyes: Negative for photophobia, discharge, redness, itching and visual disturbance.  Respiratory: Negative for cough, shortness of breath and wheezing.   Cardiovascular: Negative for chest pain, palpitations and leg swelling.  Gastrointestinal: Negative for  abdominal pain, constipation, diarrhea, nausea and vomiting.  Genitourinary: Negative for dysuria and flank pain.  Musculoskeletal: Negative for arthralgias, back pain, gait problem and neck pain.  Skin: Negative for color change.  Allergic/Immunologic: Negative for environmental allergies and food allergies.  Neurological: Negative for dizziness and headaches.  Hematological: Does not bruise/bleed easily.  Psychiatric/Behavioral: Positive for suicidal ideas. Negative for agitation, behavioral problems (depression) and hallucinations. The patient is nervous/anxious.      Vital Signs: BP 99/72   Pulse (!) 115   Temp 97.7 F (36.5 C)   Resp 16   Ht 4' 11"  (1.499 m)   Wt 171 lb 3.2 oz (77.7 kg)   SpO2 95%   BMI 34.58 kg/m    Physical Exam Vitals reviewed. Exam conducted with a chaperone present.  Constitutional:      Appearance: Normal appearance. She is obese.  Cardiovascular:     Rate and Rhythm: Normal rate and regular rhythm.     Pulses: Normal pulses.     Heart sounds: Normal heart sounds.  Pulmonary:     Effort: Pulmonary effort is normal.     Breath sounds: Normal breath sounds.  Chest:  Breasts:     Right: Normal.     Left: Normal.    Abdominal:     General: Abdomen is flat.     Palpations: Abdomen is soft.  Genitourinary:    Vagina: Normal.     Cervix: Normal.     Uterus: Normal.   Musculoskeletal:        General: Normal range of motion.     Cervical back: Normal range of motion.  Skin:    General: Skin is warm.     Comments: Self inflicted harm by cutting right wrist, areas of cut in various stages of healing  Neurological:     General: No focal deficit present.     Mental Status: She is alert and oriented to person, place, and time. Mental status is at baseline.  Psychiatric:        Mood and Affect: Mood normal.        Behavior: Behavior normal.        Thought Content: Thought content normal.        Judgment: Judgment normal.       LABS: Recent Results (from the past 2160 hour(s))  POCT glycosylated hemoglobin (Hb A1C)     Status: Abnormal   Collection Time: 05/08/20  4:22 PM  Result Value Ref Range   Hemoglobin A1C 7.6 (A) 4.0 - 5.6 %   HbA1c POC (<> result, manual entry)     HbA1c, POC (prediabetic range)     HbA1c, POC (controlled diabetic range)    CULTURE, URINE COMPREHENSIVE     Status: None   Collection Time: 05/08/20  4:23 PM   Specimen: Urine   Urine  Result Value Ref Range   Urine Culture, Comprehensive Final report    Organism ID, Bacteria Comment     Comment: Mixed urogenital flora 25,000-50,000 colony forming units per mL  POCT Urinalysis Dipstick     Status: None   Collection Time: 05/08/20  4:26 PM  Result Value Ref Range   Color, UA     Clarity, UA     Glucose, UA Negative Negative   Bilirubin, UA negative    Ketones, UA negative    Spec Grav, UA 1.015 1.010 - 1.025   Blood, UA small    pH, UA 5.0 5.0 - 8.0   Protein, UA Negative Negative   Urobilinogen, UA 0.2 0.2 or 1.0 E.U./dL   Nitrite, UA negative    Leukocytes, UA Negative Negative   Appearance     Odor    POCT HgB A1C     Status: Abnormal   Collection Time: 07/25/20 10:15 AM  Result Value Ref Range   Hemoglobin A1C 8.6 (A) 4.0 - 5.6 %   HbA1c POC (<> result, manual entry)     HbA1c, POC (prediabetic range)     HbA1c, POC (controlled diabetic range)     Assessment/Plan: 1. Encounter for routine adult health examination with abnormal findings Distraught, sad appearing 30 year old female Will need labs drawn to update--left appointment before ordering completed  2. Type 2 diabetes mellitus with hyperglycemia, without long-term current use of insulin (HCC) A1C 8.6--has not been taking medication since October Encouraged to start taking Farxiga daily, take at time of day that is easy for her remember due to her schedule - POCT HgB A1C  3. Severe episode of recurrent major depressive disorder, without psychotic  features ([place]) D/c Paxil and start Zoloft, will need referral to psychiarty - sertraline (ZOLOFT) 50 MG tablet; Take 1 tablet (50 mg total) by mouth daily.  Dispense: 30 tablet; Refill: 3  4. Sleep disturbance Insomnia, frequent awakening in the night with difficulty falling back to sleep, fatigue during the day - Home sleep test  5. Suicidal ideation Patient expressed active SI, evidence of recent suicidal attempt with cut marks in right wrist, described in HPI, she left office while I was attempting to have her escorted to hospital for inpatient psychiatry evaluation, police contacted, received multiple calls from [place], they found her at her home, denied active SI to sheriff's responding, they reached out to their crisis response team that also came to evaluate her, based on their assessment she did not meet criteria for IVC and inpatient evaluation Urgent referral has been placed in hopes to get her established with OP psychiatry as soon as possible  - Ambulatory referral to Psychiatry  6. Routine cervical smear - IGP, Aptima HPV - NuSwab Vaginitis Plus (VG+)  7. Dysuria - UA/M w/rflx Culture, Routine  General Counseling: Izadora verbalizes understanding of the findings of todays visit and agrees with plan of treatment. I have discussed any further diagnostic evaluation that may be needed or ordered today. We also reviewed her medications today. she has been encouraged to call the office with any questions or concerns that should arise related to todays visit.    Counseling:    Orders Placed This Encounter  Procedures  . UA/M w/rflx Culture, Routine  . NuSwab Vaginitis Plus (VG+)  . Ambulatory referral to Psychiatry  . POCT HgB A1C  . Home sleep test    Meds ordered this encounter  Medications  . sertraline (ZOLOFT) 50 MG tablet    Sig: Take 1 tablet (50 mg total) by mouth daily.    Dispense:  30 tablet    Refill:  3    Total time spent: 45 Minutes  Time  spent includes review of chart, medications, test results, and follow up plan with the patient.   This patient was seen by Casey Burkitt AGNP-C Collaboration with Dr Lavera Guise as a part of collaborative care agreement   Rebecca Ponce. [facility] Internal Medicine

## 2020-07-26 ENCOUNTER — Telehealth (HOSPITAL_COMMUNITY): Payer: Self-pay | Admitting: Licensed Clinical Social Worker

## 2020-07-27 LAB — IGP, APTIMA HPV: HPV Aptima: NEGATIVE

## 2020-07-28 LAB — UA/M W/RFLX CULTURE, ROUTINE
Bilirubin, UA: NEGATIVE
Leukocytes,UA: NEGATIVE
Nitrite, UA: NEGATIVE
Protein,UA: NEGATIVE
RBC, UA: NEGATIVE
Specific Gravity, UA: >=1.03 — AB (ref 1.005–1.030)
Urobilinogen, Ur: 0.2 mg/dL (ref 0.2–1.0)
pH, UA: 5 (ref 5.0–7.5)

## 2020-07-28 LAB — URINE CULTURE, REFLEX

## 2020-07-28 LAB — MICROSCOPIC EXAMINATION
Bacteria, UA: NONE SEEN
Casts: NONE SEEN /lpf
Epithelial Cells (non renal): >10 /hpf — AB (ref 0–10)
RBC, Urine: NONE SEEN /hpf (ref 0–2)

## 2020-07-28 LAB — SPECIMEN STATUS REPORT

## 2020-08-01 ENCOUNTER — Encounter: Payer: Self-pay | Admitting: Hospice and Palliative Medicine

## 2020-08-01 ENCOUNTER — Encounter: Payer: Self-pay | Admitting: Nurse Practitioner

## 2020-08-01 ENCOUNTER — Telehealth: Payer: Self-pay

## 2020-08-01 LAB — NUSWAB VAGINITIS PLUS (VG+)
Candida albicans, NAA: POSITIVE — AB
Candida glabrata, NAA: NEGATIVE
Chlamydia trachomatis, NAA: NEGATIVE
Neisseria gonorrhoeae, NAA: NEGATIVE
Trich vag by NAA: NEGATIVE

## 2020-08-02 NOTE — Telephone Encounter (Signed)
Rebecca Ponce spoke to patient

## 2020-12-31 ENCOUNTER — Encounter: Payer: Self-pay | Admitting: Internal Medicine

## 2021-01-01 ENCOUNTER — Telehealth: Payer: Self-pay

## 2021-01-01 NOTE — Telephone Encounter (Signed)
Called patient to schedule an appointment in reference to patient's mychart message for possibility of UTI. Did advise patient she would have to schedule an appointment to be seen as she has not been seen since January 2022 and that we would have to culture her urine and she refused appointment at this time. Did advise her that we have availability for her to be seen to  call our office back to schedule an appointment. Toni Amend

## 2022-04-18 ENCOUNTER — Telehealth: Payer: Self-pay | Admitting: Nurse Practitioner

## 2022-04-22 ENCOUNTER — Other Ambulatory Visit: Payer: Self-pay | Admitting: Internal Medicine

## 2022-05-06 ENCOUNTER — Encounter: Payer: Self-pay | Admitting: Nurse Practitioner

## 2022-05-08 NOTE — Telephone Encounter (Signed)
Error

## 2022-06-13 ENCOUNTER — Telehealth: Payer: Self-pay | Admitting: Internal Medicine

## 2022-06-13 NOTE — Telephone Encounter (Signed)
Left vm and sent mychart message to confirm 06/17/22 appointment-Toni

## 2022-06-17 ENCOUNTER — Encounter: Payer: Self-pay | Admitting: Nurse Practitioner

## 2022-07-22 ENCOUNTER — Telehealth: Payer: Self-pay | Admitting: Nurse Practitioner

## 2022-07-22 NOTE — Telephone Encounter (Signed)
Left vm and sent mychart message to confirm 07/25/22 appointment-Toni

## 2022-07-25 ENCOUNTER — Encounter: Payer: Self-pay | Admitting: Nurse Practitioner

## 2022-07-25 ENCOUNTER — Ambulatory Visit (INDEPENDENT_AMBULATORY_CARE_PROVIDER_SITE_OTHER): Payer: BC Managed Care – PPO | Admitting: Nurse Practitioner

## 2022-07-25 ENCOUNTER — Telehealth: Payer: Self-pay

## 2022-07-25 VITALS — BP 125/85 | HR 98 | Temp 98.6°F | Resp 16 | Ht 59.0 in | Wt 162.2 lb

## 2022-07-25 DIAGNOSIS — E538 Deficiency of other specified B group vitamins: Secondary | ICD-10-CM | POA: Diagnosis not present

## 2022-07-25 DIAGNOSIS — E782 Mixed hyperlipidemia: Secondary | ICD-10-CM

## 2022-07-25 DIAGNOSIS — Z0001 Encounter for general adult medical examination with abnormal findings: Secondary | ICD-10-CM

## 2022-07-25 DIAGNOSIS — R300 Dysuria: Secondary | ICD-10-CM

## 2022-07-25 DIAGNOSIS — Z113 Encounter for screening for infections with a predominantly sexual mode of transmission: Secondary | ICD-10-CM | POA: Diagnosis not present

## 2022-07-25 DIAGNOSIS — E559 Vitamin D deficiency, unspecified: Secondary | ICD-10-CM | POA: Diagnosis not present

## 2022-07-25 DIAGNOSIS — E1165 Type 2 diabetes mellitus with hyperglycemia: Secondary | ICD-10-CM

## 2022-07-25 LAB — POCT GLYCOSYLATED HEMOGLOBIN (HGB A1C): Hemoglobin A1C: 12.6 % — AB (ref 4.0–5.6)

## 2022-07-25 MED ORDER — TIRZEPATIDE 5 MG/0.5ML ~~LOC~~ SOAJ: 5.0000 mg | SUBCUTANEOUS | 0 refills | Status: DC

## 2022-07-25 MED ORDER — DAPAGLIFLOZIN PROPANEDIOL 10 MG PO TABS: 10.0000 mg | ORAL_TABLET | Freq: Every day | ORAL | 2 refills | Status: DC

## 2022-07-25 NOTE — Progress Notes (Signed)
[facility], [place] [address]  Internal MEDICINE  Office Visit Note  Patient Name: Gicela Schwarting  [id number]  [id number]  Date of Service: 07/25/2022  Chief Complaint  Patient presents with   Annual Exam   Hyperlipidemia   Depression   Diabetes    HPI Ailynn presents for an annual well visit and physical exam.  Well-appearing 32 y.o. female with uncontrolled diabetes Pap smear: due in 2025 Eye exam: dr. Ellin Mayhew in 2022, overdue for another eye exam foot exam: due today Labs: due for routine labs  New or worsening pain: none Other concerns: fatigue, hair loss, A1c 12.6 today Last office visit was approx 2 years ago. Since then her mother had a stroke due to complications of uncontrolled diabetes. Patient is trying to take control of her health to prevent future complications.  Has been off her medications over the past 2 years, A1c is significantly elevated to 12.6. discussed medication options. Declined the option of insulin. Interested in oral medication and weekly injectables.  Has fatigue and hair loss-- may have multiple contributing factors, overdue for routine labs.  Elevated BMI, wants to lose weight, can choose medication that will benefit her sugars as well as help with weight loss.  New insurance, not sure which glucose meter, strips and lancets that they will cover. Has onetouch ultra mini and accuchek at home.       07/25/2022    4:17 PM  Depression screen PHQ 2/9  Decreased Interest 0  Down, Depressed, Hopeless 0  PHQ - 2 Score 0       Current Medication: Outpatient Encounter Medications as of 07/25/2022  Medication Sig   tirzepatide (MOUNJARO) 5 MG/0.5ML Pen Inject 5 mg into the skin once a week.   albuterol (VENTOLIN HFA) 108 (90 Base) MCG/ACT inhaler 2 PUFF TWICE A DAY AS NEEDED FOR COUGH AND WHEEZING   blood glucose meter kit and supplies KIT Dispense based on patient and insurance preference. Use up to four times  daily as directed. (FOR ICD-9 250.00, 250.01).   dapagliflozin propanediol (FARXIGA) 10 MG TABS tablet Take 1 tablet (10 mg total) by mouth daily. Take one tab po qd for diabetes   [DISCONTINUED] Continuous Blood Gluc Sensor (FREESTYLE LIBRE SENSOR SYSTEM) MISC Change sensor every 15 days.   [DISCONTINUED] dapagliflozin propanediol (FARXIGA) 10 MG TABS tablet Take one tab po qd for diabetes   [DISCONTINUED] levofloxacin (LEVAQUIN) 500 MG tablet Take 1 tablet (500 mg total) by mouth daily.   [DISCONTINUED] sertraline (ZOLOFT) 50 MG tablet Take 1 tablet (50 mg total) by mouth daily.   No facility-administered encounter medications on file as of 07/25/2022.    Surgical History: Past Surgical History:  Procedure Laterality Date   CESAREAN SECTION     twice   CESAREAN SECTION     CESAREAN SECTION WITH BILATERAL TUBAL LIGATION N/A 06/11/2015   Procedure: CESAREAN SECTION WITH BILATERAL TUBAL LIGATION;  Surgeon: Malachy Mood, MD;  Location: [facility] ORS;  Service: Obstetrics;  Laterality: N/A;   CHOLECYSTECTOMY     TONSILLECTOMY     TONSILLECTOMY      Medical History: Past Medical History:  Diagnosis Date   Anemia    Depression    Gestational diabetes    Hyperlipidemia    Insomnia     Family History: Family History  Problem Relation Age of Onset   Anxiety disorder Mother    Diabetes Mother    Hypertension Father    Diabetes Maternal Aunt  Social History   Socioeconomic History   Marital status: Married    Spouse name: Not on file   Number of children: Not on file   Years of education: Not on file   Highest education level: Not on file  Occupational History   Not on file  Tobacco Use   Smoking status: Never   Smokeless tobacco: Never   Tobacco comments:    years ago  Vaping Use   Vaping Use: Never used  Substance and Sexual Activity   Alcohol use: Yes    Comment: socially    Drug use: Never   Sexual activity: Not on file  Other Topics Concern   Not on file   Social History Narrative   ** Merged History Encounter **       Social Determinants of Health   Financial Resource Strain: Not on file  Food Insecurity: Not on file  Transportation Needs: Not on file  Physical Activity: Not on file  Stress: Not on file  Social Connections: Not on file  Intimate Partner Violence: Not on file      Review of Systems  Constitutional:  Positive for fatigue. Negative for activity change, appetite change, chills, fever and unexpected weight change.  HENT: Negative.  Negative for congestion, ear pain, rhinorrhea, sore throat and trouble swallowing.        Hair loss  Eyes: Negative.        Sees Dr. Clydene Pugh for diabetic eye exam, needs exam for this year.   Respiratory: Negative.  Negative for cough, chest tightness, shortness of breath and wheezing.   Cardiovascular: Negative.  Negative for chest pain and palpitations.  Gastrointestinal: Negative.  Negative for abdominal pain, blood in stool, constipation, diarrhea, nausea and vomiting.  Endocrine: Positive for polydipsia, polyphagia and polyuria.  Genitourinary: Negative.  Negative for difficulty urinating, dysuria, frequency, hematuria and urgency.  Musculoskeletal: Negative.  Negative for arthralgias, back pain, joint swelling, myalgias and neck pain.  Skin: Negative.  Negative for rash and wound.  Allergic/Immunologic: Negative.  Negative for immunocompromised state.  Neurological: Negative.  Negative for dizziness, seizures, numbness and headaches.  Hematological: Negative.   Psychiatric/Behavioral: Negative.  Negative for behavioral problems, self-injury and suicidal ideas. The patient is not nervous/anxious.     Vital Signs: BP 125/85 Comment: 126/90  Pulse 98 Comment: 109  Temp 98.6 F (37 C)   Resp 16   Ht 4\' 11"  (1.499 m)   Wt 162 lb 3.2 oz (73.6 kg)   SpO2 97%   BMI 32.76 kg/m    Physical Exam Vitals reviewed.  Constitutional:      General: She is not in acute distress.     Appearance: Normal appearance. She is well-developed. She is obese. She is not ill-appearing or diaphoretic.  HENT:     Head: Normocephalic and atraumatic.     Right Ear: Tympanic membrane, ear canal and external ear normal.     Left Ear: Tympanic membrane, ear canal and external ear normal.     Nose: Nose normal. No congestion or rhinorrhea.     Mouth/Throat:     Mouth: Mucous membranes are moist.     Pharynx: Oropharynx is clear. No oropharyngeal exudate or posterior oropharyngeal erythema.  Eyes:     General: No scleral icterus.       Right eye: No discharge.        Left eye: No discharge.     Extraocular Movements: Extraocular movements intact.     Conjunctiva/sclera: Conjunctivae normal.  Pupils: Pupils are equal, round, and reactive to light.  Neck:     Thyroid: No thyromegaly.     Vascular: No carotid bruit or JVD.     Trachea: No tracheal deviation.  Cardiovascular:     Rate and Rhythm: Normal rate and regular rhythm.     Pulses:          Dorsalis pedis pulses are 3+ on the right side and 3+ on the left side.       Posterior tibial pulses are 2+ on the right side and 2+ on the left side.     Heart sounds: Normal heart sounds. No murmur heard.    No friction rub. No gallop.  Pulmonary:     Effort: Pulmonary effort is normal. No respiratory distress.     Breath sounds: Normal breath sounds. No stridor. No wheezing or rales.  Chest:     Chest wall: No tenderness.  Abdominal:     General: Bowel sounds are normal. There is no distension.     Palpations: Abdomen is soft. There is no mass.     Tenderness: There is no abdominal tenderness. There is no guarding or rebound.  Musculoskeletal:        General: No tenderness or deformity. Normal range of motion.     Cervical back: Normal range of motion and neck supple.     Right foot: Normal range of motion. No deformity, bunion, Charcot foot, foot drop or prominent metatarsal heads.     Left foot: Normal range of motion. No  deformity, bunion, Charcot foot, foot drop or prominent metatarsal heads.  Feet:     Right foot:     Protective Sensation: 6 sites tested.  6 sites sensed.     Skin integrity: No ulcer, blister, skin breakdown, erythema, warmth, callus, dry skin or fissure.     Toenail Condition: Right toenails are long.     Left foot:     Protective Sensation: 6 sites tested.  6 sites sensed.     Skin integrity: No ulcer, blister, skin breakdown, erythema, warmth, callus, dry skin or fissure.     Toenail Condition: Left toenails are long.  Lymphadenopathy:     Cervical: No cervical adenopathy.  Skin:    General: Skin is warm and dry.     Capillary Refill: Capillary refill takes less than 2 seconds.     Coloration: Skin is not pale.     Findings: No erythema or rash.  Neurological:     Mental Status: She is alert and oriented to person, place, and time.     Cranial Nerves: No cranial nerve deficit.     Motor: No abnormal muscle tone.     Coordination: Coordination normal.     Gait: Gait normal.     Deep Tendon Reflexes: Reflexes are normal and symmetric.  Psychiatric:        Mood and Affect: Mood normal.        Behavior: Behavior normal.        Thought Content: Thought content normal.        Judgment: Judgment normal.    Diabetic Foot Exam - Simple   Simple Foot Form Diabetic Foot exam was performed with the following findings: Yes 07/25/2022 12:00 PM  Visual Inspection Sensation Testing Pulse Check Comments        Assessment/Plan: 1. Encounter for routine adult health examination with abnormal findings Age-appropriate preventive screenings and vaccinations discussed, annual physical exam completed. Routine labs for health maintenance  ordered, see below. PHM updated.  - NuSwab Vaginitis Plus (VG+) - STI Profile - CBC with Differential/Platelet - CMP14+EGFR - Lipid Profile - Vitamin D (25 hydroxy) - B12 and Folate Panel - TSH + free T4  2. Type 2 diabetes mellitus with  hyperglycemia, without long-term current use of insulin (HCC) A1c is 12.6 today. Samples for farxiga and mounjaro provided to patient. Routine labs ordered. Plan to restart farxiga 10 mg daily and also sample of mounjaro 2.5 mg to step up to prescribed dose of 5 mg weekly next. Patient does not want to start insulin so we will give this regimen a chance. Patient needs test strips and lancets and will call her insurance to find out which ones they cover and then call the clinic so they can be ordered. Will follow up in about a month to see how she is tolerating the new medications and how her glucose readings are looking.  - POCT glycosylated hemoglobin (Hb A1C) - Urine Microalbumin w/creat. ratio - CBC with Differential/Platelet - CMP14+EGFR - Lipid Profile - Vitamin D (25 hydroxy) - B12 and Folate Panel - TSH + free T4 - dapagliflozin propanediol (FARXIGA) 10 MG TABS tablet; Take 1 tablet (10 mg total) by mouth daily. Take one tab po qd for diabetes  Dispense: 30 tablet; Refill: 2 - tirzepatide (MOUNJARO) 5 MG/0.5ML Pen; Inject 5 mg into the skin once a week.  Dispense: 6 mL; Refill: 0   3. B12 deficiency Routine labs ordered - CBC with Differential/Platelet - CMP14+EGFR - Lipid Profile - Vitamin D (25 hydroxy) - B12 and Folate Panel - TSH + free T4  4. Vitamin D deficiency Routine labs ordered - CBC with Differential/Platelet - CMP14+EGFR - Lipid Profile - Vitamin D (25 hydroxy) - B12 and Folate Panel - TSH + free T4  5. Mixed hyperlipidemia Routine labs ordered - CBC with Differential/Platelet - CMP14+EGFR - Lipid Profile - Vitamin D (25 hydroxy) - B12 and Folate Panel - TSH + free T4  6. Dysuria Routine urinalysis done - UA/M w/rflx Culture, Routine  7. Encounter for screening for infections with predominantly sexual mode of transmission Blind vaginal swab to rule out BV, yeast or STI done. Also ordered blood testing for STI as requested by patient.  - NuSwab  Vaginitis Plus (VG+) - STI Profile     General Counseling: Tacey verbalizes understanding of the findings of todays visit and agrees with plan of treatment. I have discussed any further diagnostic evaluation that may be needed or ordered today. We also reviewed her medications today. she has been encouraged to call the office with any questions or concerns that should arise related to todays visit.    Orders Placed This Encounter  Procedures   UA/M w/rflx Culture, Routine   Urine Microalbumin w/creat. ratio   NuSwab Vaginitis Plus (VG+)   STI Profile   CBC with Differential/Platelet   CMP14+EGFR   Lipid Profile   Vitamin D (25 hydroxy)   B12 and Folate Panel   TSH + free T4   POCT glycosylated hemoglobin (Hb A1C)    Meds ordered this encounter  Medications   tirzepatide (MOUNJARO) 5 MG/0.5ML Pen    Sig: Inject 5 mg into the skin once a week.    Dispense:  6 mL    Refill:  0    Please send PA asap.   dapagliflozin propanediol (FARXIGA) 10 MG TABS tablet    Sig: Take 1 tablet (10 mg total) by mouth daily. Take one tab  po qd for diabetes    Dispense:  30 tablet    Refill:  2    Fill new script today. Dx code E11.65    Return in about 1 month (around 08/25/2022) for F/U, eval new med, Labs, Sherod Cisse PCP.   Total time spent:30 Minutes Time spent includes review of chart, medications, test results, and follow up plan with the patient.   [place] Controlled Substance Database was reviewed by me.  This patient was seen by Jonetta Osgood, FNP-C in collaboration with Dr. Clayborn Bigness as a part of collaborative care agreement.  Dennies Coate R. Valetta Fuller, MSN, FNP-C Internal medicine

## 2022-07-25 NOTE — Telephone Encounter (Signed)
Completed P.A. for patient's Mounjaro. 

## 2022-07-26 LAB — CMP14+EGFR
ALT: 20 IU/L (ref 0–32)
AST: 13 IU/L (ref 0–40)
Albumin/Globulin Ratio: 2 (ref 1.2–2.2)
Albumin: 4.9 g/dL (ref 3.9–4.9)
Alkaline Phosphatase: 88 IU/L (ref 44–121)
BUN/Creatinine Ratio: 17 (ref 9–23)
BUN: 12 mg/dL (ref 6–20)
Bilirubin Total: 1 mg/dL (ref 0.0–1.2)
CO2: 20 mmol/L (ref 20–29)
Calcium: 9.4 mg/dL (ref 8.7–10.2)
Chloride: 99 mmol/L (ref 96–106)
Creatinine, Ser: 0.71 mg/dL (ref 0.57–1.00)
Globulin, Total: 2.5 g/dL (ref 1.5–4.5)
Glucose: 288 mg/dL — ABNORMAL HIGH (ref 70–99)
Potassium: 4 mmol/L (ref 3.5–5.2)
Sodium: 136 mmol/L (ref 134–144)
Total Protein: 7.4 g/dL (ref 6.0–8.5)
eGFR: 115 mL/min/{1.73_m2} (ref >59)

## 2022-07-26 LAB — TSH+FREE T4
Free T4: 1.31 ng/dL (ref 0.82–1.77)
TSH: 1.55 u[IU]/mL (ref 0.450–4.500)

## 2022-07-26 LAB — CBC WITH DIFFERENTIAL/PLATELET
Basophils Absolute: 0.1 10*3/uL (ref 0.0–0.2)
Basos: 1 %
EOS (ABSOLUTE): 0.2 10*3/uL (ref 0.0–0.4)
Eos: 2 %
Hematocrit: 45.6 % (ref 34.0–46.6)
Hemoglobin: 15.3 g/dL (ref 11.1–15.9)
Immature Grans (Abs): 0 10*3/uL (ref 0.0–0.1)
Immature Granulocytes: 0 %
Lymphocytes Absolute: 2.2 10*3/uL (ref 0.7–3.1)
Lymphs: 22 %
MCH: 30.8 pg (ref 26.6–33.0)
MCHC: 33.6 g/dL (ref 31.5–35.7)
MCV: 92 fL (ref 79–97)
Monocytes Absolute: 0.4 10*3/uL (ref 0.1–0.9)
Monocytes: 4 %
Neutrophils Absolute: 7.5 10*3/uL — ABNORMAL HIGH (ref 1.4–7.0)
Neutrophils: 71 %
Platelets: 226 10*3/uL (ref 150–450)
RBC: 4.97 x10E6/uL (ref 3.77–5.28)
RDW: 12.2 % (ref 11.7–15.4)
WBC: 10.4 10*3/uL (ref 3.4–10.8)

## 2022-07-26 LAB — LIPID PANEL
Chol/HDL Ratio: 4.1 ratio (ref 0.0–4.4)
Cholesterol, Total: 213 mg/dL — ABNORMAL HIGH (ref 100–199)
HDL: 52 mg/dL (ref >39)
LDL Chol Calc (NIH): 127 mg/dL — ABNORMAL HIGH (ref 0–99)
Triglycerides: 191 mg/dL — ABNORMAL HIGH (ref 0–149)
VLDL Cholesterol Cal: 34 mg/dL (ref 5–40)

## 2022-07-26 LAB — STI PROFILE
HCV Ab: NONREACTIVE
HIV Screen 4th Generation wRfx: NONREACTIVE
Hep B Core Total Ab: NEGATIVE
Hep B Surface Ab, Qual: NONREACTIVE
Hepatitis B Surface Ag: NEGATIVE
RPR Ser Ql: NONREACTIVE

## 2022-07-26 LAB — VITAMIN D 25 HYDROXY (VIT D DEFICIENCY, FRACTURES): Vit D, 25-Hydroxy: 10.2 ng/mL — ABNORMAL LOW (ref 30.0–100.0)

## 2022-07-26 LAB — HCV INTERPRETATION

## 2022-07-26 LAB — B12 AND FOLATE PANEL
Folate: 16.8 ng/mL (ref >3.0)
Vitamin B-12: 516 pg/mL (ref 232–1245)

## 2022-07-28 ENCOUNTER — Other Ambulatory Visit: Payer: Self-pay

## 2022-07-28 ENCOUNTER — Telehealth: Payer: Self-pay

## 2022-07-28 ENCOUNTER — Other Ambulatory Visit: Payer: Self-pay | Admitting: Nurse Practitioner

## 2022-07-28 DIAGNOSIS — E559 Vitamin D deficiency, unspecified: Secondary | ICD-10-CM

## 2022-07-28 LAB — UA/M W/RFLX CULTURE, ROUTINE
Bilirubin, UA: NEGATIVE
Leukocytes,UA: NEGATIVE
Nitrite, UA: NEGATIVE
Protein,UA: NEGATIVE
RBC, UA: NEGATIVE
Specific Gravity, UA: >=1.03 — AB (ref 1.005–1.030)
Urobilinogen, Ur: 0.2 mg/dL (ref 0.2–1.0)
pH, UA: 5 (ref 5.0–7.5)

## 2022-07-28 LAB — MICROSCOPIC EXAMINATION
Bacteria, UA: NONE SEEN
Casts: NONE SEEN /lpf
RBC, Urine: NONE SEEN /hpf (ref 0–2)

## 2022-07-28 LAB — MICROALBUMIN / CREATININE URINE RATIO
Creatinine, Urine: 66.1 mg/dL
Microalb/Creat Ratio: 21 mg/g creat (ref 0–29)
Microalbumin, Urine: 13.8 ug/mL

## 2022-07-28 LAB — URINE CULTURE, REFLEX

## 2022-07-28 MED ORDER — FREESTYLE LIBRE 3 SENSOR MISC: 4 refills | Status: DC

## 2022-07-28 MED ORDER — VITAMIN D (ERGOCALCIFEROL) 1.25 MG (50000 UNIT) PO CAPS: 50000.0000 [IU] | ORAL_CAPSULE | ORAL | 1 refills | Status: DC

## 2022-07-28 NOTE — Telephone Encounter (Signed)
-----  Message from Alyssa Abernathy, NP sent at 07/28/2022  8:30 AM EST ----- I have reviewed the lab results. There are no critically abnormal values requiring immediate intervention but there are some abnormals that will be discussed at the next office visit.   Exception: low vitamin D level, I am sending a weekly prescription vitamin D supplement, we will check vitamin D level again in 6 months.  

## 2022-07-28 NOTE — Telephone Encounter (Signed)
Pt notified for labs and send freestyle [place]

## 2022-07-28 NOTE — Telephone Encounter (Signed)
-----  Message from Jonetta Osgood, NP sent at 07/28/2022  8:30 AM EST ----- I have reviewed the lab results. There are no critically abnormal values requiring immediate intervention but there are some abnormals that will be discussed at the next office visit.   Exception: low vitamin D level, I am sending a weekly prescription vitamin D supplement, we will check vitamin D level again in 6 months.

## 2022-07-28 NOTE — Progress Notes (Signed)
I have reviewed the lab results. There are no critically abnormal values requiring immediate intervention but there are some abnormals that will be discussed at the next office visit.   Exception: low vitamin D level, I am sending a weekly prescription vitamin D supplement, we will check vitamin D level again in 6 months.

## 2022-07-28 NOTE — Telephone Encounter (Signed)
Left message for patient regarding lab results and Vitamin D supplement that was sent to pharmacy.

## 2022-07-30 LAB — NUSWAB VAGINITIS PLUS (VG+)
Candida albicans, NAA: NEGATIVE
Candida glabrata, NAA: NEGATIVE
Chlamydia trachomatis, NAA: NEGATIVE
Neisseria gonorrhoeae, NAA: NEGATIVE
Trich vag by NAA: NEGATIVE

## 2022-08-19 ENCOUNTER — Encounter: Payer: Self-pay | Admitting: Nurse Practitioner

## 2022-08-19 ENCOUNTER — Ambulatory Visit (INDEPENDENT_AMBULATORY_CARE_PROVIDER_SITE_OTHER): Payer: BC Managed Care – PPO | Admitting: Nurse Practitioner

## 2022-08-19 VITALS — BP 118/80 | HR 90 | Temp 98.2°F | Resp 16 | Ht 59.0 in | Wt 154.2 lb

## 2022-08-19 DIAGNOSIS — E782 Mixed hyperlipidemia: Secondary | ICD-10-CM

## 2022-08-19 DIAGNOSIS — E1165 Type 2 diabetes mellitus with hyperglycemia: Secondary | ICD-10-CM | POA: Diagnosis not present

## 2022-08-19 DIAGNOSIS — E559 Vitamin D deficiency, unspecified: Secondary | ICD-10-CM | POA: Diagnosis not present

## 2022-08-19 MED ORDER — DAPAGLIFLOZIN PROPANEDIOL 10 MG PO TABS: 10.0000 mg | ORAL_TABLET | Freq: Every day | ORAL | 2 refills | Status: DC

## 2022-08-19 MED ORDER — VITAMIN D (ERGOCALCIFEROL) 1.25 MG (50000 UNIT) PO CAPS: 50000.0000 [IU] | ORAL_CAPSULE | ORAL | 1 refills | Status: DC

## 2022-08-19 MED ORDER — TIRZEPATIDE 5 MG/0.5ML ~~LOC~~ SOAJ: 5.0000 mg | SUBCUTANEOUS | 0 refills | Status: DC

## 2022-08-19 NOTE — Progress Notes (Signed)
[facility] [place], [place] [address]  Internal MEDICINE  Office Visit Note  Patient Name: Rebecca Ponce  [id number]  [id number]  Date of Service: 08/19/2022  Chief Complaint  Patient presents with   Follow-up    HPI Rebecca Ponce presents for a follow-up visit for diabetes and lab results.  Diabetes -- started on farxiga and mounjaro at previous visit. Has the freestyle libre 3 to monitor glucose levels, sugars have been less than 300 with an average to 133 over the past few weeks. Has lost 8 lbs since her last visit.  Cholesterol levels are elevated -- uncontrolled diabetes can cause elevated cholesterol levels.  Low vitamin D -- started on weekly prescription supplement prior to visit  Reviewed the rest of her lab results which were ok.     Current Medication: Outpatient Encounter Medications as of 08/19/2022  Medication Sig   blood glucose meter kit and supplies KIT Dispense based on patient and insurance preference. Use up to four times daily as directed. (FOR ICD-9 250.00, 250.01).   Continuous Blood Gluc Sensor (FREESTYLE LIBRE 3 SENSOR) MISC Use as directed every 14 days DX e11.65   [DISCONTINUED] dapagliflozin propanediol (FARXIGA) 10 MG TABS tablet Take 1 tablet (10 mg total) by mouth daily. Take one tab po qd for diabetes   [DISCONTINUED] tirzepatide (MOUNJARO) 5 MG/0.5ML Pen Inject 5 mg into the skin once a week.   [DISCONTINUED] Vitamin D, Ergocalciferol, (DRISDOL) 1.25 MG (50000 UNIT) CAPS capsule Take 1 capsule (50,000 Units total) by mouth every 7 (seven) days.   albuterol (VENTOLIN HFA) 108 (90 Base) MCG/ACT inhaler 2 PUFF TWICE A DAY AS NEEDED FOR COUGH AND WHEEZING   dapagliflozin propanediol (FARXIGA) 10 MG TABS tablet Take 1 tablet (10 mg total) by mouth daily. Take one tab po qd for diabetes   tirzepatide (MOUNJARO) 5 MG/0.5ML Pen Inject 5 mg into the skin once a week.   Vitamin D, Ergocalciferol, (DRISDOL) 1.25 MG (50000 UNIT) CAPS capsule  Take 1 capsule (50,000 Units total) by mouth every 7 (seven) days.   No facility-administered encounter medications on file as of 08/19/2022.    Surgical History: Past Surgical History:  Procedure Laterality Date   CESAREAN SECTION     twice   CESAREAN SECTION     CESAREAN SECTION WITH BILATERAL TUBAL LIGATION N/A 06/11/2015   Procedure: CESAREAN SECTION WITH BILATERAL TUBAL LIGATION;  Surgeon: Malachy Mood, MD;  Location: [facility] ORS;  Service: Obstetrics;  Laterality: N/A;   CHOLECYSTECTOMY     TONSILLECTOMY     TONSILLECTOMY      Medical History: Past Medical History:  Diagnosis Date   Anemia    Depression    Gestational diabetes    Hyperlipidemia    Insomnia     Family History: Family History  Problem Relation Age of Onset   Anxiety disorder Mother    Diabetes Mother    Hypertension Father    Diabetes Maternal Aunt     Social History   Socioeconomic History   Marital status: Married    Spouse name: Not on file   Number of children: Not on file   Years of education: Not on file   Highest education level: Not on file  Occupational History   Not on file  Tobacco Use   Smoking status: Never   Smokeless tobacco: Never   Tobacco comments:    years ago  Vaping Use   Vaping Use: Never used  Substance and Sexual Activity  Alcohol use: Yes    Comment: socially    Drug use: Never   Sexual activity: Not on file  Other Topics Concern   Not on file  Social History Narrative   ** Merged History Encounter **       Social Determinants of Health   Financial Resource Strain: Not on file  Food Insecurity: Not on file  Transportation Needs: Not on file  Physical Activity: Not on file  Stress: Not on file  Social Connections: Not on file  Intimate Partner Violence: Not on file      Review of Systems  Constitutional:  Negative for chills, fatigue and unexpected weight change.  HENT:  Positive for postnasal drip. Negative for congestion, rhinorrhea,  sneezing and sore throat.   Eyes:  Negative for redness.  Respiratory: Negative.  Negative for cough, chest tightness and shortness of breath.   Cardiovascular: Negative.  Negative for chest pain and palpitations.  Gastrointestinal:  Negative for abdominal pain, constipation, diarrhea, nausea and vomiting.  Endocrine: Negative for polydipsia, polyphagia and polyuria.  Genitourinary:  Negative for dysuria and frequency.  Musculoskeletal:  Negative for arthralgias, back pain, joint swelling and neck pain.  Skin:  Negative for rash.  Neurological: Negative.  Negative for tremors and numbness.  Hematological:  Negative for adenopathy. Does not bruise/bleed easily.  Psychiatric/Behavioral:  Negative for behavioral problems (Depression), sleep disturbance and suicidal ideas. The patient is not nervous/anxious.     Vital Signs: BP 118/80   Pulse 90   Temp 98.2 F (36.8 C)   Resp 16   Ht 4\' 11"  (1.499 m)   Wt 154 lb 3.2 oz (69.9 kg)   SpO2 98%   BMI 31.14 kg/m    Physical Exam Vitals reviewed.  Constitutional:      General: She is not in acute distress.    Appearance: Normal appearance. She is obese. She is not ill-appearing.  HENT:     Head: Normocephalic and atraumatic.  Eyes:     Pupils: Pupils are equal, round, and reactive to light.  Cardiovascular:     Rate and Rhythm: Normal rate and regular rhythm.  Pulmonary:     Effort: Pulmonary effort is normal. No respiratory distress.  Neurological:     Mental Status: She is alert and oriented to person, place, and time.  Psychiatric:        Mood and Affect: Mood normal.        Behavior: Behavior normal.        Assessment/Plan: 1. Type 2 diabetes mellitus with hyperglycemia, without long-term current use of insulin (HCC) Continue farxiga and mounjaro as prescribed.  - tirzepatide [facility]) 5 MG/0.5ML Pen; Inject 5 mg into the skin once a week.  Dispense: 6 mL; Refill: 0 - dapagliflozin propanediol (FARXIGA) 10 MG TABS  tablet; Take 1 tablet (10 mg total) by mouth daily. Take one tab po qd for diabetes  Dispense: 30 tablet; Refill: 2  2. Mixed hyperlipidemia Getting better control of glucose level will help improve cholesterol as well as weight loss, eating a healthier diet and getting exercise. Will repeat lab in 3-6 months.   3. Vitamin D deficiency Continue weekly supplement, reordered to request 90 day supply. - Vitamin D, Ergocalciferol, (DRISDOL) 1.25 MG (50000 UNIT) CAPS capsule; Take 1 capsule (50,000 Units total) by mouth every 7 (seven) days.  Dispense: 12 capsule; Refill: 1   General Counseling: Zane verbalizes understanding of the findings of todays visit and agrees with plan of treatment. I have  discussed any further diagnostic evaluation that may be needed or ordered today. We also reviewed her medications today. she has been encouraged to call the office with any questions or concerns that should arise related to todays visit.    No orders of the defined types were placed in this encounter.   Meds ordered this encounter  Medications   Vitamin D, Ergocalciferol, (DRISDOL) 1.25 MG (50000 UNIT) CAPS capsule    Sig: Take 1 capsule (50,000 Units total) by mouth every 7 (seven) days.    Dispense:  12 capsule    Refill:  1    Please give patient 3 month supply thanks!   tirzepatide [facility]) 5 MG/0.5ML Pen    Sig: Inject 5 mg into the skin once a week.    Dispense:  6 mL    Refill:  0    Please fill as 3 month (12 week) supply.   dapagliflozin propanediol (FARXIGA) 10 MG TABS tablet    Sig: Take 1 tablet (10 mg total) by mouth daily. Take one tab po qd for diabetes    Dispense:  30 tablet    Refill:  2    Fill new script today. Dx code E11.65    Return in about 9 weeks (around 10/21/2022) for F/U, Recheck A1C, Shadd Dunstan PCP.   Total time spent:30 Minutes Time spent includes review of chart, medications, test results, and follow up plan with the patient.   [place] Controlled Substance  Database was reviewed by me.  This patient was seen by Jonetta Osgood, FNP-C in collaboration with Dr. Clayborn Bigness as a part of collaborative care agreement.   Niketa Turner R. Valetta Fuller, MSN, FNP-C Internal medicine

## 2022-10-05 ENCOUNTER — Other Ambulatory Visit: Payer: Self-pay | Admitting: Nurse Practitioner

## 2022-10-05 DIAGNOSIS — E1165 Type 2 diabetes mellitus with hyperglycemia: Secondary | ICD-10-CM

## 2022-10-08 ENCOUNTER — Telehealth: Payer: Self-pay

## 2022-10-13 ENCOUNTER — Telehealth: Payer: Self-pay

## 2022-10-13 MED ORDER — EMPAGLIFLOZIN 25 MG PO TABS: 25.0000 mg | ORAL_TABLET | Freq: Every day | ORAL | 3 refills | Status: DC

## 2022-10-13 NOTE — Telephone Encounter (Signed)
Pt advised that we send pres to [facility] and samples is ready for pickup until she hear from her phar

## 2022-10-13 NOTE — Telephone Encounter (Signed)
[name] not covered. Rebecca Ponce is covered, prescription sent

## 2022-10-21 ENCOUNTER — Encounter: Payer: Self-pay | Admitting: Nurse Practitioner

## 2022-10-21 ENCOUNTER — Ambulatory Visit (INDEPENDENT_AMBULATORY_CARE_PROVIDER_SITE_OTHER): Payer: BC Managed Care – PPO | Admitting: Nurse Practitioner

## 2022-10-21 VITALS — BP 97/65 | Temp 98.5°F | Resp 16 | Ht 59.0 in | Wt 143.6 lb

## 2022-10-21 DIAGNOSIS — J4520 Mild intermittent asthma, uncomplicated: Secondary | ICD-10-CM

## 2022-10-21 DIAGNOSIS — E1165 Type 2 diabetes mellitus with hyperglycemia: Secondary | ICD-10-CM | POA: Diagnosis not present

## 2022-10-21 DIAGNOSIS — R059 Cough, unspecified: Secondary | ICD-10-CM

## 2022-10-21 DIAGNOSIS — E559 Vitamin D deficiency, unspecified: Secondary | ICD-10-CM | POA: Diagnosis not present

## 2022-10-21 MED ORDER — VITAMIN D (ERGOCALCIFEROL) 1.25 MG (50000 UNIT) PO CAPS
50000.0000 [IU] | ORAL_CAPSULE | ORAL | 1 refills | Status: AC
Start: 2022-10-21 — End: ?

## 2022-10-21 MED ORDER — ALBUTEROL SULFATE HFA 108 (90 BASE) MCG/ACT IN AERS: 1.0000 | INHALATION_SPRAY | Freq: Four times a day (QID) | RESPIRATORY_TRACT | 5 refills | Status: DC | PRN

## 2022-10-21 MED ORDER — TIRZEPATIDE 5 MG/0.5ML ~~LOC~~ SOAJ
5.0000 mg | SUBCUTANEOUS | 0 refills | Status: DC
Start: 2022-10-21 — End: 2022-12-05

## 2022-10-21 MED ORDER — EMPAGLIFLOZIN 25 MG PO TABS: 25.0000 mg | ORAL_TABLET | Freq: Every day | ORAL | 3 refills | Status: DC

## 2022-10-21 MED ORDER — FREESTYLE LIBRE 3 SENSOR MISC: 4 refills | Status: DC

## 2022-10-21 NOTE — Progress Notes (Signed)
[facility] [address] [place], [place] [address]  Internal MEDICINE  Office Visit Note  Patient Name: Rebecca Ponce  [id number]  [id number]  Date of Service: 10/21/2022  Chief Complaint  Patient presents with   Depression   Diabetes   Hyperlipidemia    HPI Rebecca Ponce presents for a follow-up visit for diabetes, low vitamin D and asthma Diabetes -- average glucose is 115 over the past 30 days. Lost 11 more lbs. Copay amount increased, discussed copay assistance.  Has been taking weekly vitamin D supplement.  Need refills of albuterol    Current Medication: Outpatient Encounter Medications as of 10/21/2022  Medication Sig   blood glucose meter kit and supplies KIT Dispense based on patient and insurance preference. Use up to four times daily as directed. (FOR ICD-9 250.00, 250.01).   [DISCONTINUED] Continuous Blood Gluc Sensor (FREESTYLE LIBRE 3 SENSOR) MISC Use as directed every 14 days DX e11.65   [DISCONTINUED] empagliflozin (JARDIANCE) 25 MG TABS tablet Take 1 tablet (25 mg total) by mouth daily.   [DISCONTINUED] tirzepatide [facility]) 5 MG/0.5ML Pen Inject 5 mg into the skin once a week.   [DISCONTINUED] Vitamin D, Ergocalciferol, (DRISDOL) 1.25 MG (50000 UNIT) CAPS capsule Take 1 capsule (50,000 Units total) by mouth every 7 (seven) days.   albuterol (VENTOLIN HFA) 108 (90 Base) MCG/ACT inhaler Inhale 1-2 puffs into the lungs every 6 (six) hours as needed for wheezing or shortness of breath.   Continuous Blood Gluc Sensor (FREESTYLE LIBRE 3 SENSOR) MISC Use as directed every 14 days DX e11.65   empagliflozin (JARDIANCE) 25 MG TABS tablet Take 1 tablet (25 mg total) by mouth daily.   tirzepatide [facility]) 5 MG/0.5ML Pen Inject 5 mg into the skin once a week.   Vitamin D, Ergocalciferol, (DRISDOL) 1.25 MG (50000 UNIT) CAPS capsule Take 1 capsule (50,000 Units total) by mouth every 7 (seven) days.   [DISCONTINUED] albuterol (VENTOLIN HFA) 108 (90 Base) MCG/ACT inhaler  2 PUFF TWICE A DAY AS NEEDED FOR COUGH AND WHEEZING   No facility-administered encounter medications on file as of 10/21/2022.    Surgical History: Past Surgical History:  Procedure Laterality Date   CESAREAN SECTION     twice   CESAREAN SECTION     CESAREAN SECTION WITH BILATERAL TUBAL LIGATION N/A 06/11/2015   Procedure: CESAREAN SECTION WITH BILATERAL TUBAL LIGATION;  Surgeon: Vena Austria, MD;  Location: [facility] ORS;  Service: Obstetrics;  Laterality: N/A;   CHOLECYSTECTOMY     TONSILLECTOMY     TONSILLECTOMY      Medical History: Past Medical History:  Diagnosis Date   Anemia    Depression    Gestational diabetes    Hyperlipidemia    Insomnia     Family History: Family History  Problem Relation Age of Onset   Anxiety disorder Mother    Diabetes Mother    Hypertension Father    Diabetes Maternal Aunt     Social History   Socioeconomic History   Marital status: Married    Spouse name: Not on file   Number of children: Not on file   Years of education: Not on file   Highest education level: Not on file  Occupational History   Not on file  Tobacco Use   Smoking status: Never   Smokeless tobacco: Never   Tobacco comments:    years ago  Vaping Use   Vaping Use: Never used  Substance and Sexual Activity   Alcohol use: Yes    Comment:  socially    Drug use: Never   Sexual activity: Not on file  Other Topics Concern   Not on file  Social History Narrative   ** Merged History Encounter **       Social Determinants of Health   Financial Resource Strain: Not on file  Food Insecurity: Not on file  Transportation Needs: Not on file  Physical Activity: Not on file  Stress: Not on file  Social Connections: Not on file  Intimate Partner Violence: Not on file      Review of Systems  Constitutional:  Positive for unexpected weight change. Negative for chills and fatigue.  HENT:  Positive for postnasal drip. Negative for congestion, rhinorrhea, sneezing  and sore throat.   Eyes:  Negative for redness.  Respiratory: Negative.  Negative for cough, chest tightness and shortness of breath.   Cardiovascular: Negative.  Negative for chest pain and palpitations.  Gastrointestinal:  Negative for abdominal pain, constipation, diarrhea, nausea and vomiting.  Endocrine: Negative for polydipsia, polyphagia and polyuria.  Genitourinary:  Negative for dysuria and frequency.  Musculoskeletal:  Negative for arthralgias, back pain, joint swelling and neck pain.  Skin:  Negative for rash.  Neurological: Negative.  Negative for tremors and numbness.  Hematological:  Negative for adenopathy. Does not bruise/bleed easily.  Psychiatric/Behavioral:  Negative for behavioral problems (Depression), sleep disturbance and suicidal ideas. The patient is not nervous/anxious.     Vital Signs: BP 97/65   Temp 98.5 F (36.9 C)   Resp 16   Ht 4\' 11"  (1.499 m)   Wt 143 lb 9.6 oz (65.1 kg)   BMI 29.00 kg/m    Physical Exam Vitals reviewed.  Constitutional:      General: She is not in acute distress.    Appearance: Normal appearance. She is obese. She is not ill-appearing.  HENT:     Head: Normocephalic and atraumatic.  Eyes:     Pupils: Pupils are equal, round, and reactive to light.  Cardiovascular:     Rate and Rhythm: Normal rate and regular rhythm.  Pulmonary:     Effort: Pulmonary effort is normal. No respiratory distress.  Neurological:     Mental Status: She is alert and oriented to person, place, and time.  Psychiatric:        Mood and Affect: Mood normal.        Behavior: Behavior normal.        Assessment/Plan: 1. Type 2 diabetes mellitus with hyperglycemia, without long-term current use of insulin Continue mounjaro 5 mg weekly as prescribed  Continue jardiance as prescribed.  Continue using freestyle libre 3 as instructed to conveniently check glucose levels.  - Continuous Blood Gluc Sensor (FREESTYLE LIBRE 3 SENSOR) MISC; Use as  directed every 14 days DX e11.65  Dispense: 2 each; Refill: 4 - empagliflozin (JARDIANCE) 25 MG TABS tablet; Take 1 tablet (25 mg total) by mouth daily.  Dispense: 90 tablet; Refill: 3 - tirzepatide (MOUNJARO) 5 MG/0.5ML Pen; Inject 5 mg into the skin once a week.  Dispense: 6 mL; Refill: 0  2. Vitamin D deficiency Significantly low vitamin D, has been taking OTC, switch to weekly prescription supplement - Vitamin D, Ergocalciferol, (DRISDOL) 1.25 MG (50000 UNIT) CAPS capsule; Take 1 capsule (50,000 Units total) by mouth every 7 (seven) days.  Dispense: 12 capsule; Refill: 1  3. Mild intermittent asthma without complication Continue prn albuterol inhaler as prescribed - albuterol (VENTOLIN HFA) 108 (90 Base) MCG/ACT inhaler; Inhale 1-2 puffs into the lungs every  6 (six) hours as needed for wheezing or shortness of breath.  Dispense: 6.7 each; Refill: 5   General Counseling: Rebecca Ponce verbalizes understanding of the findings of todays visit and agrees with plan of treatment. I have discussed any further diagnostic evaluation that may be needed or ordered today. We also reviewed her medications today. she has been encouraged to call the office with any questions or concerns that should arise related to todays visit.    No orders of the defined types were placed in this encounter.   Meds ordered this encounter  Medications   albuterol (VENTOLIN HFA) 108 (90 Base) MCG/ACT inhaler    Sig: Inhale 1-2 puffs into the lungs every 6 (six) hours as needed for wheezing or shortness of breath.    Dispense:  6.7 each    Refill:  5   Continuous Blood Gluc Sensor (FREESTYLE LIBRE 3 SENSOR) MISC    Sig: Use as directed every 14 days DX e11.65    Dispense:  2 each    Refill:  4   empagliflozin (JARDIANCE) 25 MG TABS tablet    Sig: Take 1 tablet (25 mg total) by mouth daily.    Dispense:  90 tablet    Refill:  3    Dx E11.65. please fill for 90 days   tirzepatide [facility](MOUNJARO) 5 MG/0.5ML Pen    Sig:  Inject 5 mg into the skin once a week.    Dispense:  6 mL    Refill:  0    Please fill as 3 month (12 week) supply.   Vitamin D, Ergocalciferol, (DRISDOL) 1.25 MG (50000 UNIT) CAPS capsule    Sig: Take 1 capsule (50,000 Units total) by mouth every 7 (seven) days.    Dispense:  12 capsule    Refill:  1    Please give patient 3 month supply thanks!    Return in about 2 months (around 12/21/2022) for F/U, Recheck A1C, Caide Campi PCP and repeat labs .   Total time spent:30 Minutes Time spent includes review of chart, medications, test results, and follow up plan with the patient.   [place] Controlled Substance Database was reviewed by me.  This patient was seen by Sallyanne KusterAlyssa Seerat Peaden, FNP-C in collaboration with Dr. Beverely RisenFozia Khan as a part of collaborative care agreement.   Darrelyn Morro R. Tedd SiasAbernathy, MSN, FNP-C Internal medicine

## 2022-11-01 ENCOUNTER — Encounter: Payer: Self-pay | Admitting: Nurse Practitioner

## 2022-12-05 ENCOUNTER — Telehealth: Payer: Self-pay

## 2022-12-05 DIAGNOSIS — E1165 Type 2 diabetes mellitus with hyperglycemia: Secondary | ICD-10-CM

## 2022-12-05 MED ORDER — TIRZEPATIDE 5 MG/0.5ML ~~LOC~~ SOAJ
5.0000 mg | SUBCUTANEOUS | 0 refills | Status: DC
Start: 2022-12-05 — End: 2023-01-06

## 2022-12-05 NOTE — Telephone Encounter (Signed)
Pt advised we sent med and send mychart message

## 2022-12-20 ENCOUNTER — Other Ambulatory Visit: Payer: Self-pay | Admitting: Nurse Practitioner

## 2022-12-20 DIAGNOSIS — E1165 Type 2 diabetes mellitus with hyperglycemia: Secondary | ICD-10-CM

## 2022-12-23 ENCOUNTER — Telehealth: Payer: Self-pay

## 2022-12-23 ENCOUNTER — Ambulatory Visit: Payer: BC Managed Care – PPO | Admitting: Nurse Practitioner

## 2022-12-23 NOTE — Telephone Encounter (Signed)
Completed P.A. for patient's Jardiance. 

## 2022-12-23 NOTE — Telephone Encounter (Signed)
Please do PA

## 2023-01-06 ENCOUNTER — Ambulatory Visit (INDEPENDENT_AMBULATORY_CARE_PROVIDER_SITE_OTHER): Payer: BC Managed Care – PPO | Admitting: Nurse Practitioner

## 2023-01-06 ENCOUNTER — Encounter: Payer: Self-pay | Admitting: Nurse Practitioner

## 2023-01-06 VITALS — BP 100/82 | HR 112 | Temp 98.6°F | Resp 16 | Ht 59.0 in | Wt 147.8 lb

## 2023-01-06 DIAGNOSIS — E559 Vitamin D deficiency, unspecified: Secondary | ICD-10-CM | POA: Diagnosis not present

## 2023-01-06 DIAGNOSIS — E782 Mixed hyperlipidemia: Secondary | ICD-10-CM

## 2023-01-06 DIAGNOSIS — E1165 Type 2 diabetes mellitus with hyperglycemia: Secondary | ICD-10-CM

## 2023-01-06 LAB — POCT GLYCOSYLATED HEMOGLOBIN (HGB A1C): Hemoglobin A1C: 7.1 % — AB (ref 4.0–5.6)

## 2023-01-06 MED ORDER — EMPAGLIFLOZIN 25 MG PO TABS
25.0000 mg | ORAL_TABLET | Freq: Every day | ORAL | 5 refills | Status: DC
Start: 2023-01-06 — End: 2023-01-09

## 2023-01-06 MED ORDER — TIRZEPATIDE 7.5 MG/0.5ML ~~LOC~~ SOAJ
7.5000 mg | SUBCUTANEOUS | 1 refills | Status: DC
Start: 2023-01-06 — End: 2023-07-31

## 2023-01-06 NOTE — Progress Notes (Signed)
[facility] [address] [place], [place] [address]  Internal MEDICINE  Office Visit Note  Patient Name: Rebecca Ponce  [id number]  [id number]  Date of Service: 01/06/2023  Chief Complaint  Patient presents with   Depression   Diabetes   Hyperlipidemia   Follow-up    HPI Rebecca Ponce presents for a follow-up visit for diabetes, high cholesterol, low vitamin D Diabetes -- A1c greatly improved to 7.1. great job! Currently taking mounjaro and jardiance.  Low vitamin D -- has been taking weekly prescription supplement High cholesterol -- has been taking medication to control her glucose levels which will help improve her cholesterol Weight loss -- initially lost 11 lbs but gained back 4 lbs.  Psychosocial stressors -- mother is in a rehab center and sister and her mother's friend have POA and did not consult her. She did not know where her mother was for 10 months after she had a stroke last year. She has been spending time helping to care for her mother and is going to talk to the social worker to help sort out the issues with the POA paperwork.      Current Medication: Outpatient Encounter Medications as of 01/06/2023  Medication Sig   albuterol (VENTOLIN HFA) 108 (90 Base) MCG/ACT inhaler Inhale 1-2 puffs into the lungs every 6 (six) hours as needed for wheezing or shortness of breath.   blood glucose meter kit and supplies KIT Dispense based on patient and insurance preference. Use up to four times daily as directed. (FOR ICD-9 250.00, 250.01).   Continuous Blood Gluc Sensor (FREESTYLE LIBRE 3 SENSOR) MISC Use as directed every 14 days DX e11.65   empagliflozin (JARDIANCE) 25 MG TABS tablet Take 1 tablet (25 mg total) by mouth daily.   empagliflozin (JARDIANCE) 25 MG TABS tablet Take 1 tablet (25 mg total) by mouth daily before breakfast.   tirzepatide (MOUNJARO) 5 MG/0.5ML Pen Inject 5 mg into the skin once a week.   tirzepatide (MOUNJARO) 7.5 MG/0.5ML Pen Inject 7.5 mg  into the skin once a week.   Vitamin D, Ergocalciferol, (DRISDOL) 1.25 MG (50000 UNIT) CAPS capsule Take 1 capsule (50,000 Units total) by mouth every 7 (seven) days.   No facility-administered encounter medications on file as of 01/06/2023.    Surgical History: Past Surgical History:  Procedure Laterality Date   CESAREAN SECTION     twice   CESAREAN SECTION     CESAREAN SECTION WITH BILATERAL TUBAL LIGATION N/A 06/11/2015   Procedure: CESAREAN SECTION WITH BILATERAL TUBAL LIGATION;  Surgeon: Vena Austria, MD;  Location: [facility] ORS;  Service: Obstetrics;  Laterality: N/A;   CHOLECYSTECTOMY     TONSILLECTOMY     TONSILLECTOMY      Medical History: Past Medical History:  Diagnosis Date   Anemia    Depression    Gestational diabetes    Hyperlipidemia    Insomnia     Family History: Family History  Problem Relation Age of Onset   Anxiety disorder Mother    Diabetes Mother    Hypertension Father    Diabetes Maternal Aunt     Social History   Socioeconomic History   Marital status: Married    Spouse name: Not on file   Number of children: Not on file   Years of education: Not on file   Highest education level: Not on file  Occupational History   Not on file  Tobacco Use   Smoking status: Never   Smokeless tobacco: Never  Tobacco comments:    years ago  Vaping Use   Vaping Use: Never used  Substance and Sexual Activity   Alcohol use: Yes    Comment: socially    Drug use: Never   Sexual activity: Not on file  Other Topics Concern   Not on file  Social History Narrative   ** Merged History Encounter **       Social Determinants of Health   Financial Resource Strain: Not on file  Food Insecurity: Not on file  Transportation Needs: Not on file  Physical Activity: Not on file  Stress: Not on file  Social Connections: Not on file  Intimate Partner Violence: Not on file      Review of Systems  Constitutional:  Positive for unexpected weight change.  Negative for chills and fatigue.  HENT:  Positive for postnasal drip. Negative for congestion, rhinorrhea, sneezing and sore throat.   Eyes:  Negative for redness.  Respiratory: Negative.  Negative for cough, chest tightness and shortness of breath.   Cardiovascular: Negative.  Negative for chest pain and palpitations.  Gastrointestinal:  Negative for abdominal pain, constipation, diarrhea, nausea and vomiting.  Endocrine: Negative for polydipsia, polyphagia and polyuria.  Genitourinary:  Negative for dysuria and frequency.  Musculoskeletal:  Negative for arthralgias, back pain, joint swelling and neck pain.  Skin:  Negative for rash.  Neurological: Negative.  Negative for tremors and numbness.  Hematological:  Negative for adenopathy. Does not bruise/bleed easily.  Psychiatric/Behavioral:  Negative for behavioral problems (Depression), sleep disturbance and suicidal ideas. The patient is not nervous/anxious.     Vital Signs: BP 100/82   Pulse (!) 112   Temp 98.6 F (37 C)   Resp 16   Ht 4\' 11"  (1.499 m)   Wt 147 lb 12.8 oz (67 kg)   SpO2 99%   BMI 29.85 kg/m    Physical Exam Vitals reviewed.  Constitutional:      General: She is not in acute distress.    Appearance: Normal appearance. She is obese. She is not ill-appearing.  HENT:     Head: Normocephalic and atraumatic.  Eyes:     Pupils: Pupils are equal, round, and reactive to light.  Cardiovascular:     Rate and Rhythm: Normal rate and regular rhythm.  Pulmonary:     Effort: Pulmonary effort is normal. No respiratory distress.  Neurological:     Mental Status: She is alert and oriented to person, place, and time.  Psychiatric:        Mood and Affect: Mood normal.        Behavior: Behavior normal.        Assessment/Plan: 1. Type 2 diabetes mellitus with hyperglycemia, without long-term current use of insulin (HCC) Mounjaro dose increased. Continue jardiance and mounjaro as prescribed. A1c is greatly improved.  Repeat A1c in 4 months.  - POCT glycosylated hemoglobin (Hb A1C) - empagliflozin (JARDIANCE) 25 MG TABS tablet; Take 1 tablet (25 mg total) by mouth daily before breakfast.  Dispense: 30 tablet; Refill: 5 - tirzepatide (MOUNJARO) 7.5 MG/0.5ML Pen; Inject 7.5 mg into the skin once a week.  Dispense: 6 mL; Refill: 1  2. Mixed hyperlipidemia Repeat lipid panel lab - Lipid Profile  3. Vitamin D deficiency Repeat vitamin D lab - Vitamin D (25 hydroxy)   General Counseling: Vada verbalizes understanding of the findings of todays visit and agrees with plan of treatment. I have discussed any further diagnostic evaluation that may be needed or ordered today. We  also reviewed her medications today. she has been encouraged to call the office with any questions or concerns that should arise related to todays visit.    Orders Placed This Encounter  Procedures   Lipid Profile   Vitamin D (25 hydroxy)   POCT glycosylated hemoglobin (Hb A1C)    Meds ordered this encounter  Medications   empagliflozin (JARDIANCE) 25 MG TABS tablet    Sig: Take 1 tablet (25 mg total) by mouth daily before breakfast.    Dispense:  30 tablet    Refill:  5    Please send mail order prescription please. 3 month prescription if allowed.   tirzepatide (MOUNJARO) 7.5 MG/0.5ML Pen    Sig: Inject 7.5 mg into the skin once a week.    Dispense:  6 mL    Refill:  1    Please fill as 3 month (12 week) supply. As a mail order prescription please. Dx code E11.65    Return in about 4 months (around 05/08/2023) for F/U, Recheck A1C, Shirin Echeverry PCP.   Total time spent:30 Minutes Time spent includes review of chart, medications, test results, and follow up plan with the patient.   [place] Controlled Substance Database was reviewed by me.  This patient was seen by Sallyanne Kuster, FNP-C in collaboration with Dr. Beverely Risen as a part of collaborative care agreement.   [place] Mccuen R. Tedd Sias, MSN, FNP-C Internal medicine

## 2023-01-07 ENCOUNTER — Encounter: Payer: Self-pay | Admitting: Nurse Practitioner

## 2023-01-08 ENCOUNTER — Telehealth: Payer: Self-pay

## 2023-01-09 MED ORDER — STEGLATRO 15 MG PO TABS: 15.0000 mg | ORAL_TABLET | Freq: Every day | ORAL | 3 refills | Status: DC

## 2023-01-09 NOTE — Telephone Encounter (Signed)
Pt advised we sent med and also we gave samples  and coupon for pickup

## 2023-05-08 ENCOUNTER — Ambulatory Visit: Payer: Self-pay | Admitting: Nurse Practitioner

## 2023-07-31 ENCOUNTER — Encounter: Payer: Self-pay | Admitting: Nurse Practitioner

## 2023-07-31 ENCOUNTER — Ambulatory Visit (INDEPENDENT_AMBULATORY_CARE_PROVIDER_SITE_OTHER): Payer: No Typology Code available for payment source | Admitting: Nurse Practitioner

## 2023-07-31 VITALS — BP 130/84 | HR 103 | Temp 98.6°F | Resp 16 | Ht 59.0 in | Wt 157.6 lb

## 2023-07-31 DIAGNOSIS — J4520 Mild intermittent asthma, uncomplicated: Secondary | ICD-10-CM

## 2023-07-31 DIAGNOSIS — E1169 Type 2 diabetes mellitus with other specified complication: Secondary | ICD-10-CM | POA: Diagnosis not present

## 2023-07-31 DIAGNOSIS — E1165 Type 2 diabetes mellitus with hyperglycemia: Secondary | ICD-10-CM

## 2023-07-31 DIAGNOSIS — Z0001 Encounter for general adult medical examination with abnormal findings: Secondary | ICD-10-CM | POA: Diagnosis not present

## 2023-07-31 DIAGNOSIS — E785 Hyperlipidemia, unspecified: Secondary | ICD-10-CM

## 2023-07-31 DIAGNOSIS — I152 Hypertension secondary to endocrine disorders: Secondary | ICD-10-CM

## 2023-07-31 LAB — POCT GLYCOSYLATED HEMOGLOBIN (HGB A1C): Hemoglobin A1C: 11.7 % — AB (ref 4.0–5.6)

## 2023-07-31 MED ORDER — TIRZEPATIDE 5 MG/0.5ML ~~LOC~~ SOAJ: 5.0000 mg | SUBCUTANEOUS | 1 refills | Status: DC

## 2023-07-31 MED ORDER — EMPAGLIFLOZIN 25 MG PO TABS: ORAL_TABLET | ORAL | 3 refills | Status: DC

## 2023-07-31 NOTE — Progress Notes (Signed)
[facility] [address] [place], [place] [address]  Internal MEDICINE  Office Visit Note  Patient Name: Rebecca Ponce  [id number]  [id number]  Date of Service: 07/31/2023  Chief Complaint  Patient presents with   Depression   Diabetes   Hyperlipidemia   Annual Exam    HPI Rebecca Ponce presents for an annual well visit and physical exam.  Well-appearing 33 y.o. year-old @female @ with ___________  Pap smear: due in 2027 Eye exam: has eye appointment in 2 weeks  foot exam: done today  Labs: due for routine labs  New or worsening pain: none  Diabetes -- A1c is elevated at 11.7 now which is significantly increased from prior at 7.1. She has stopped taking her weekly mounjaro dose    Current Medication: Outpatient Encounter Medications as of 07/31/2023  Medication Sig   albuterol (VENTOLIN HFA) 108 (90 Base) MCG/ACT inhaler Inhale 1-2 puffs into the lungs every 6 (six) hours as needed for wheezing or shortness of breath.   blood glucose meter kit and supplies KIT Dispense based on patient and insurance preference. Use up to four times daily as directed. (FOR ICD-9 250.00, 250.01).   empagliflozin (JARDIANCE) 25 MG TABS tablet Take one tab a day for diabetes   tirzepatide (MOUNJARO) 5 MG/0.5ML Pen Inject 5 mg into the skin once a week.   Vitamin D, Ergocalciferol, (DRISDOL) 1.25 MG (50000 UNIT) CAPS capsule Take 1 capsule (50,000 Units total) by mouth every 7 (seven) days.   [DISCONTINUED] Continuous Blood Gluc Sensor (FREESTYLE LIBRE 3 SENSOR) MISC Use as directed every 14 days DX e11.65   [DISCONTINUED] ertugliflozin L-PyroglutamicAc (STEGLATRO) 15 MG TABS tablet Take 1 tablet (15 mg total) by mouth daily.   [DISCONTINUED] tirzepatide (MOUNJARO) 7.5 MG/0.5ML Pen Inject 7.5 mg into the skin once a week.   No facility-administered encounter medications on file as of 07/31/2023.    Surgical History: Past Surgical History:  Procedure Laterality Date   CESAREAN SECTION      twice   CESAREAN SECTION     CESAREAN SECTION WITH BILATERAL TUBAL LIGATION N/A 06/11/2015   Procedure: CESAREAN SECTION WITH BILATERAL TUBAL LIGATION;  Surgeon: Vena Austria, MD;  Location: [facility] ORS;  Service: Obstetrics;  Laterality: N/A;   CHOLECYSTECTOMY     TONSILLECTOMY     TONSILLECTOMY      Medical History: Past Medical History:  Diagnosis Date   Anemia    Depression    Gestational diabetes    Hyperlipidemia    Insomnia     Family History: Family History  Problem Relation Age of Onset   Anxiety disorder Mother    Diabetes Mother    Hypertension Father    Diabetes Maternal Aunt     Social History   Socioeconomic History   Marital status: Married    Spouse name: Not on file   Number of children: Not on file   Years of education: Not on file   Highest education level: Not on file  Occupational History   Not on file  Tobacco Use   Smoking status: Never   Smokeless tobacco: Never   Tobacco comments:    years ago  Vaping Use   Vaping status: Never Used  Substance and Sexual Activity   Alcohol use: Yes    Comment: socially    Drug use: Never   Sexual activity: Not on file  Other Topics Concern   Not on file  Social History Narrative   ** Merged History Encounter **  Social Drivers of Corporate investment banker Strain: Not on file  Food Insecurity: Not on file  Transportation Needs: Not on file  Physical Activity: Not on file  Stress: Not on file  Social Connections: Not on file  Intimate Partner Violence: Not on file      Review of Systems  Vital Signs: BP 130/84   Pulse (!) 103   Temp 98.6 F (37 C)   Resp 16   Ht 4\' 11"  (1.499 m)   Wt 157 lb 9.6 oz (71.5 kg)   SpO2 98%   BMI 31.83 kg/m    Physical Exam     Assessment/Plan: 1. Type 2 diabetes mellitus with hyperglycemia, without long-term current use of insulin (HCC) (Primary) - POCT glycosylated hemoglobin (Hb A1C) - Urine Microalbumin w/creat. ratio  2.  Encounter for routine adult health examination with abnormal findings  3. Hypertension associated with type 2 diabetes mellitus (HCC)  4. Hyperlipidemia associated with type 2 diabetes mellitus (HCC)  5. Mild intermittent asthma without complication     General Counseling: Illeana verbalizes understanding of the findings of todays visit and agrees with plan of treatment. I have discussed any further diagnostic evaluation that may be needed or ordered today. We also reviewed her medications today. she has been encouraged to call the office with any questions or concerns that should arise related to todays visit.    Orders Placed This Encounter  Procedures   Urine Microalbumin w/creat. ratio   POCT glycosylated hemoglobin (Hb A1C)    Meds ordered this encounter  Medications   empagliflozin (JARDIANCE) 25 MG TABS tablet    Sig: Take one tab a day for diabetes    Dispense:  90 tablet    Refill:  3    Please send prior authorization request asap through fax # [phone] or via [email or web address]. dx code E11.65   tirzepatide [facility]) 5 MG/0.5ML Pen    Sig: Inject 5 mg into the skin once a week.    Dispense:  2 mL    Refill:  1    Please send prior authorization asap to fax #[phone] or via [email or web address]. dx code E11.65    Return in about 1 month (around 08/31/2023) for F/U, Gearline Spilman PCP, eval new med.   Total time spent:*** Minutes Time spent includes review of chart, medications, test results, and follow up plan with the patient.   [place] Controlled Substance Database was reviewed by me.  This patient was seen by Sallyanne Kuster, FNP-C in collaboration with Dr. Beverely Risen as a part of collaborative care agreement.  Jmichael Gille R. Tedd Sias, MSN, FNP-C Internal medicine

## 2023-08-01 LAB — MICROALBUMIN / CREATININE URINE RATIO
Creatinine, Urine: 92.2 mg/dL
Microalb/Creat Ratio: 14 mg/g{creat} (ref 0–29)
Microalbumin, Urine: 12.5 ug/mL

## 2023-08-03 ENCOUNTER — Encounter: Payer: Self-pay | Admitting: Nurse Practitioner

## 2023-08-04 ENCOUNTER — Encounter (HOSPITAL_COMMUNITY): Payer: Self-pay | Admitting: Emergency Medicine

## 2023-08-04 ENCOUNTER — Emergency Department (HOSPITAL_COMMUNITY): Payer: No Typology Code available for payment source

## 2023-08-04 ENCOUNTER — Emergency Department (HOSPITAL_COMMUNITY)
Admission: EM | Admit: 2023-08-04 | Discharge: 2023-08-05 | Disposition: A | Discharge status: Home / Self Care | Payer: No Typology Code available for payment source | Attending: Emergency Medicine | Admitting: Emergency Medicine

## 2023-08-04 ENCOUNTER — Other Ambulatory Visit: Payer: Self-pay

## 2023-08-04 DIAGNOSIS — J09X2 Influenza due to identified novel influenza A virus with other respiratory manifestations: Secondary | ICD-10-CM | POA: Insufficient documentation

## 2023-08-04 DIAGNOSIS — Z20822 Contact with and (suspected) exposure to covid-19: Secondary | ICD-10-CM | POA: Insufficient documentation

## 2023-08-04 DIAGNOSIS — R059 Cough, unspecified: Secondary | ICD-10-CM | POA: Diagnosis present

## 2023-08-04 DIAGNOSIS — Z91040 Latex allergy status: Secondary | ICD-10-CM | POA: Insufficient documentation

## 2023-08-04 DIAGNOSIS — J101 Influenza due to other identified influenza virus with other respiratory manifestations: Secondary | ICD-10-CM

## 2023-08-04 LAB — RESP PANEL BY RT-PCR (RSV, FLU A&B, COVID)  RVPGX2
Influenza A by PCR: POSITIVE — AB
Influenza B by PCR: NEGATIVE
Resp Syncytial Virus by PCR: NEGATIVE
SARS Coronavirus 2 by RT PCR: NEGATIVE

## 2023-08-04 LAB — BASIC METABOLIC PANEL
Anion gap: 19 — ABNORMAL HIGH (ref 5–15)
BUN: 17 mg/dL (ref 6–20)
CO2: 15 mmol/L — ABNORMAL LOW (ref 22–32)
Calcium: 9.3 mg/dL (ref 8.9–10.3)
Chloride: 100 mmol/L (ref 98–111)
Creatinine, Ser: 0.88 mg/dL (ref 0.44–1.00)
GFR, Estimated: >60 mL/min (ref >60)
Glucose, Bld: 171 mg/dL — ABNORMAL HIGH (ref 70–99)
Potassium: 3.4 mmol/L — ABNORMAL LOW (ref 3.5–5.1)
Sodium: 134 mmol/L — ABNORMAL LOW (ref 135–145)

## 2023-08-04 LAB — TROPONIN I (HIGH SENSITIVITY): Troponin I (High Sensitivity): 3 ng/L (ref <18)

## 2023-08-04 LAB — CBC
HCT: 48 % — ABNORMAL HIGH (ref 36.0–46.0)
Hemoglobin: 16.2 g/dL — ABNORMAL HIGH (ref 12.0–15.0)
MCH: 31.2 pg (ref 26.0–34.0)
MCHC: 33.8 g/dL (ref 30.0–36.0)
MCV: 92.5 fL (ref 80.0–100.0)
Platelets: 280 10*3/uL (ref 150–400)
RBC: 5.19 MIL/uL — ABNORMAL HIGH (ref 3.87–5.11)
RDW: 12.1 % (ref 11.5–15.5)
WBC: 11.8 10*3/uL — ABNORMAL HIGH (ref 4.0–10.5)
nRBC: 0 % (ref 0.0–0.2)

## 2023-08-04 LAB — I-STAT CG4 LACTIC ACID, ED: Lactic Acid, Venous: 1.2 mmol/L (ref 0.5–1.9)

## 2023-08-04 LAB — HCG, SERUM, QUALITATIVE: Preg, Serum: NEGATIVE

## 2023-08-04 MED ORDER — ACETAMINOPHEN 325 MG PO TABS
650.0000 mg | ORAL_TABLET | Freq: Once | ORAL | Status: AC
  Administered 2023-08-04: 650 mg via ORAL
  Filled 2023-08-04: qty 2

## 2023-08-04 NOTE — ED Triage Notes (Signed)
Pt here for fever, chills, vomiting, diarrhea, L sided chest pain, and body aches since Sunday. States she feels like her HR increases when she is up and moving and when she coughs.

## 2023-08-05 LAB — TROPONIN I (HIGH SENSITIVITY): Troponin I (High Sensitivity): 3 ng/L (ref <18)

## 2023-08-05 LAB — I-STAT CG4 LACTIC ACID, ED: Lactic Acid, Venous: 1.2 mmol/L (ref 0.5–1.9)

## 2023-08-05 MED ORDER — ONDANSETRON 4 MG PO TBDP: 4.0000 mg | ORAL_TABLET | Freq: Three times a day (TID) | ORAL | 0 refills | Status: DC | PRN

## 2023-08-05 NOTE — ED Provider Notes (Signed)
EMERGENCY DEPARTMENT AT [facility] Provider Note   CSN: [id number] Arrival date & time: 08/04/23  1952     History  Chief Complaint  Patient presents with   Influenza    Rebecca Ponce is a 33 y.o. female.  The history is provided by the patient and medical records.  Influenza Presenting symptoms: cough, diarrhea, fever, nausea and vomiting    33 year old female with history of depression, diabetes, presenting to the ED with fever, chills, nausea, vomiting, and diarrhea.  States she generally feels unwell.  She works from home but children do go to school and she visits her mother at nursing home often so possible sick contact.  She does report fever and chills.  Has been able to drink a little bit of water here but has not really been eating much solid food.  Denies any shortness of breath.  No abdominal pain.  Home Medications Prior to Admission medications   Medication Sig Start Date End Date Taking? Authorizing Provider  albuterol (VENTOLIN HFA) 108 (90 Base) MCG/ACT inhaler Inhale 1-2 puffs into the lungs every 6 (six) hours as needed for wheezing or shortness of breath. 10/21/22   Sallyanne Kuster, NP  blood glucose meter kit and supplies KIT Dispense based on patient and insurance preference. Use up to four times daily as directed. (FOR ICD-9 250.00, 250.01). 10/10/19   Pennie Banter, DO  empagliflozin (JARDIANCE) 25 MG TABS tablet Take one tab a day for diabetes 07/31/23   Sallyanne Kuster, NP  tirzepatide [facility]) 5 MG/0.5ML Pen Inject 5 mg into the skin once a week. 07/31/23   Sallyanne Kuster, NP  Vitamin D, Ergocalciferol, (DRISDOL) 1.25 MG (50000 UNIT) CAPS capsule Take 1 capsule (50,000 Units total) by mouth every 7 (seven) days. 10/21/22   Sallyanne Kuster, NP      Allergies    Latex, Penicillins, Latex, and Penicillins    Review of Systems   Review of Systems  Constitutional:  Positive for fever.  Respiratory:  Positive for cough.    Gastrointestinal:  Positive for diarrhea, nausea and vomiting.  All other systems reviewed and are negative.   Physical Exam Updated Vital Signs BP (!) 127/96 (BP Location: Right Arm)   Pulse (!) 116   Temp 98.5 F (36.9 C) (Oral)   Resp 16   Ht 4\' 11"  (1.499 m)   Wt 71.2 kg   SpO2 97%   BMI 31.71 kg/m   Physical Exam Vitals and nursing note reviewed.  Constitutional:      Appearance: She is well-developed.  HENT:     Head: Normocephalic and atraumatic.  Eyes:     Conjunctiva/sclera: Conjunctivae normal.     Pupils: Pupils are equal, round, and reactive to light.  Cardiovascular:     Rate and Rhythm: Normal rate and regular rhythm.     Heart sounds: Normal heart sounds.  Pulmonary:     Effort: Pulmonary effort is normal.     Breath sounds: Normal breath sounds. No wheezing or rhonchi.  Abdominal:     General: Bowel sounds are normal.     Palpations: Abdomen is soft.     Tenderness: There is no abdominal tenderness. There is no guarding or rebound.     Comments: Normal bowel sounds, soft, nontender  Musculoskeletal:        General: Normal range of motion.     Cervical back: Normal range of motion.  Skin:    General: Skin is warm and dry.  Neurological:  Mental Status: She is alert and oriented to person, place, and time.     ED Results / Procedures / Treatments   Labs (all labs ordered are listed, but only abnormal results are displayed) Labs Reviewed  RESP PANEL BY RT-PCR (RSV, FLU A&B, COVID)  RVPGX2 - Abnormal; Notable for the following components:      Result Value   Influenza A by PCR POSITIVE (*)    All other components within normal limits  BASIC METABOLIC PANEL - Abnormal; Notable for the following components:   Sodium 134 (*)    Potassium 3.4 (*)    CO2 15 (*)    Glucose, Bld 171 (*)    Anion gap 19 (*)    All other components within normal limits  CBC - Abnormal; Notable for the following components:   WBC 11.8 (*)    RBC 5.19 (*)     Hemoglobin 16.2 (*)    HCT 48.0 (*)    All other components within normal limits  HCG, SERUM, QUALITATIVE  I-STAT CG4 LACTIC ACID, ED  I-STAT CG4 LACTIC ACID, ED  TROPONIN I (HIGH SENSITIVITY)  TROPONIN I (HIGH SENSITIVITY)    EKG None  Radiology DG Chest 2 View Result Date: 08/04/2023 CLINICAL DATA:  Chest pain, congestion, and body aches EXAM: CHEST - 2 VIEW COMPARISON:  10/07/2019 FINDINGS: The heart size and mediastinal contours are within normal limits. Both lungs are clear. The visualized skeletal structures are unremarkable. IMPRESSION: No active cardiopulmonary disease. Electronically Signed   By: Minerva Fester M.D.   On: 08/04/2023 20:27    Procedures Procedures    Medications Ordered in ED Medications  acetaminophen (TYLENOL) tablet 650 mg (650 mg Oral Given 08/04/23 2012)    ED Course/ Medical Decision Making/ A&P                                 Medical Decision Making Amount and/or Complexity of Data Reviewed Labs: ordered. Radiology: ordered and independent interpretation performed. ECG/medicine tests: ordered and independent interpretation performed.  Risk OTC drugs. Prescription drug management.   33 year old female presenting to the ED with nausea, vomiting, diarrhea, fever, and bodyaches for the past 2 days.  Had a low-grade fever on arrival and some mild tachycardia.  This seems to have improved with fever control.  She has been drinking fluids while in the lobby.  Labs as above--does have some mild electrolyte derangements, likely from GI losses.  Gap is mildly elevated but sugar appears around her baseline.  She does have poorly controlled diabetes.  Admits she has not taken her diabetic medications today.  RVP is positive for influenza A.  Likely source of her symptoms.  As she is diabetic with some minor electrolyte derangements and some mild ongoing tachycardia, I have offered her IV fluids here and recheck of BMP, however she declines.  States she  just wants to go home and will hydrate well there.  We discussed risk versus benefits of Tamiflu and she declined that as well.  Will have her follow-up closely with PCP.  Return here for new concerns.  Final Clinical Impression(s) / ED Diagnoses Final diagnoses:  Influenza A    Rx / DC Orders ED Discharge Orders          Ordered    ondansetron (ZOFRAN-ODT) 4 MG disintegrating tablet  Every 8 hours PRN        08/05/23 0256  Garlon Hatchet, PA-C 08/05/23 0316    Melene Plan, DO 08/05/23 [phone]

## 2023-08-05 NOTE — Discharge Instructions (Addendum)
You have tested positive for Flu A today. Your electrolytes were a little abnormal today which we discussed.  You elected not to have IV fluids.  I do recommend that you hydrate well at home.  Keep a close eye on your blood sugar. Can use the Zofran as needed for nausea/vomiting. Recommend to follow-up closely with your primary care doctor. Return here for new concerns.

## 2023-08-13 ENCOUNTER — Telehealth: Payer: Self-pay

## 2023-08-13 NOTE — Telephone Encounter (Signed)
Per Alyssa patient needs to stay with going to her eye appointment tomorrow and it may could be from her sugars being up but they working on getting them down.  Patient notified

## 2023-08-28 ENCOUNTER — Encounter: Payer: Self-pay | Admitting: Nurse Practitioner

## 2023-08-28 ENCOUNTER — Ambulatory Visit (INDEPENDENT_AMBULATORY_CARE_PROVIDER_SITE_OTHER): Payer: No Typology Code available for payment source | Admitting: Nurse Practitioner

## 2023-08-28 ENCOUNTER — Telehealth: Payer: Self-pay | Admitting: Nurse Practitioner

## 2023-08-28 VITALS — BP 132/88 | HR 102 | Temp 98.3°F | Resp 16 | Ht 59.0 in | Wt 150.0 lb

## 2023-08-28 DIAGNOSIS — E1169 Type 2 diabetes mellitus with other specified complication: Secondary | ICD-10-CM | POA: Diagnosis not present

## 2023-08-28 DIAGNOSIS — R0789 Other chest pain: Secondary | ICD-10-CM | POA: Diagnosis not present

## 2023-08-28 DIAGNOSIS — M542 Cervicalgia: Secondary | ICD-10-CM | POA: Diagnosis not present

## 2023-08-28 DIAGNOSIS — D751 Secondary polycythemia: Secondary | ICD-10-CM | POA: Diagnosis not present

## 2023-08-28 DIAGNOSIS — E785 Hyperlipidemia, unspecified: Secondary | ICD-10-CM

## 2023-08-28 MED ORDER — TIRZEPATIDE 5 MG/0.5ML ~~LOC~~ SOAJ: 5.0000 mg | SUBCUTANEOUS | 1 refills | Status: DC

## 2023-08-28 MED ORDER — TRAMADOL HCL 50 MG PO TABS: 50.0000 mg | ORAL_TABLET | Freq: Four times a day (QID) | ORAL | 0 refills | Status: DC | PRN

## 2023-08-28 MED ORDER — CYCLOBENZAPRINE HCL 10 MG PO TABS
10.0000 mg | ORAL_TABLET | Freq: Every evening | ORAL | 0 refills | Status: AC | PRN
Start: 2023-08-28 — End: ?

## 2023-08-28 NOTE — Telephone Encounter (Signed)
Lvm notifying patient of soft tissue & chest CT appointment date, arrival time, location-Rebecca Ponce

## 2023-08-28 NOTE — Progress Notes (Signed)
[facility] [address] [place], [place] [address]  Internal MEDICINE  Office Visit Note  Patient Name: Rebecca Ponce  [id number]  [id number]  Date of Service: 08/28/2023  Chief Complaint  Patient presents with   Depression   Hyperlipidemia   Diabetes    HPI Rebecca Ponce presents for a follow-up visit for chest pain and ED visit.  Has been having chest pain since late January, describes as throbbing pain Pain is constant, rated pain 7 out of 10, but pain is worse at night Pain originates on left side of chest and radiates to left shoulder, down left upper arm and sometimes up the left side of the neck.  Went to ER on 1/21 -- EKG was done and showed a long Qtc of .58 and sinus tachycardia  She also had influenza A at the time as well.  --has tried resting, topical aspercreme and lidocaine, ibuprofen and tylenol with no relief or improvement noted.  EKG done in office, result was NSR, QT interval is normal today.    Current Medication: Outpatient Encounter Medications as of 08/28/2023  Medication Sig   albuterol (VENTOLIN HFA) 108 (90 Base) MCG/ACT inhaler Inhale 1-2 puffs into the lungs every 6 (six) hours as needed for wheezing or shortness of breath.   blood glucose meter kit and supplies KIT Dispense based on patient and insurance preference. Use up to four times daily as directed. (FOR ICD-9 250.00, 250.01).   cyclobenzaprine (FLEXERIL) 10 MG tablet Take 1 tablet (10 mg total) by mouth at bedtime as needed (musculoskeletal pain).   empagliflozin (JARDIANCE) 25 MG TABS tablet Take one tab a day for diabetes   tirzepatide (MOUNJARO) 5 MG/0.5ML Pen Inject 5 mg into the skin once a week.   traMADol (ULTRAM) 50 MG tablet Take 1 tablet (50 mg total) by mouth every 6 (six) hours as needed for up to 5 days.   Vitamin D, Ergocalciferol, (DRISDOL) 1.25 MG (50000 UNIT) CAPS capsule Take 1 capsule (50,000 Units total) by mouth every 7 (seven) days.   [DISCONTINUED] ondansetron  (ZOFRAN-ODT) 4 MG disintegrating tablet Take 1 tablet (4 mg total) by mouth every 8 (eight) hours as needed for nausea.   No facility-administered encounter medications on file as of 08/28/2023.    Surgical History: Past Surgical History:  Procedure Laterality Date   CESAREAN SECTION     twice   CESAREAN SECTION     CESAREAN SECTION WITH BILATERAL TUBAL LIGATION N/A 06/11/2015   Procedure: CESAREAN SECTION WITH BILATERAL TUBAL LIGATION;  Surgeon: Vena Austria, MD;  Location: [facility] ORS;  Service: Obstetrics;  Laterality: N/A;   CHOLECYSTECTOMY     TONSILLECTOMY     TONSILLECTOMY      Medical History: Past Medical History:  Diagnosis Date   Anemia    Depression    Gestational diabetes    Hyperlipidemia    Insomnia     Family History: Family History  Problem Relation Age of Onset   Anxiety disorder Mother    Diabetes Mother    Hypertension Father    Diabetes Maternal Aunt     Social History   Socioeconomic History   Marital status: Married    Spouse name: Not on file   Number of children: Not on file   Years of education: Not on file   Highest education level: Not on file  Occupational History   Not on file  Tobacco Use   Smoking status: Never   Smokeless tobacco: Never   Tobacco comments:  years ago  Vaping Use   Vaping status: Never Used  Substance and Sexual Activity   Alcohol use: Yes    Comment: socially    Drug use: Never   Sexual activity: Not on file  Other Topics Concern   Not on file  Social History Narrative   ** Merged History Encounter **       Social Drivers of Corporate investment banker Strain: Not on file  Food Insecurity: Not on file  Transportation Needs: Not on file  Physical Activity: Not on file  Stress: Not on file  Social Connections: Not on file  Intimate Partner Violence: Not on file      Review of Systems  Constitutional:  Positive for unexpected weight change. Negative for chills and fatigue.  HENT:  Positive  for postnasal drip. Negative for congestion, rhinorrhea, sneezing and sore throat.   Eyes:  Negative for redness.  Respiratory: Negative.  Negative for cough, chest tightness and shortness of breath.   Cardiovascular:  Positive for chest pain. Negative for palpitations.  Gastrointestinal:  Negative for abdominal pain, constipation, diarrhea, nausea and vomiting.  Endocrine: Negative for polydipsia, polyphagia and polyuria.  Genitourinary:  Negative for dysuria and frequency.  Musculoskeletal:  Negative for arthralgias, back pain, joint swelling and neck pain.  Skin:  Negative for rash.  Neurological: Negative.  Negative for tremors and numbness.  Hematological:  Negative for adenopathy. Does not bruise/bleed easily.  Psychiatric/Behavioral:  Negative for behavioral problems (Depression), sleep disturbance and suicidal ideas. The patient is not nervous/anxious.     Vital Signs: BP 132/88   Pulse (!) 102   Temp 98.3 F (36.8 C)   Resp 16   Ht 4\' 11"  (1.499 m)   Wt 150 lb (68 kg)   SpO2 98%   BMI 30.30 kg/m    Physical Exam Vitals reviewed.  Constitutional:      General: She is not in acute distress.    Appearance: Normal appearance. She is obese. She is not ill-appearing.  HENT:     Head: Normocephalic and atraumatic.  Eyes:     Pupils: Pupils are equal, round, and reactive to light.  Cardiovascular:     Rate and Rhythm: Regular rhythm. Tachycardia present.     Heart sounds: Normal heart sounds. No murmur heard. Pulmonary:     Effort: No respiratory distress.     Breath sounds: Normal breath sounds.  Neurological:     Mental Status: She is alert and oriented to person, place, and time.  Psychiatric:        Mood and Affect: Mood normal.        Behavior: Behavior normal.        Assessment/Plan: 1. Atypical chest pain (Primary) EKG is normal. CT chest and CT neck ordered to rule out possible causes of the chest and neck pain. Try prn tramadol and cyclobenzaprine as  prescribed. - EKG 12-Lead - CT Soft Tissue Neck W Contrast; Future - CT Chest W Contrast; Future - traMADol (ULTRAM) 50 MG tablet; Take 1 tablet (50 mg total) by mouth every 6 (six) hours as needed for up to 5 days.  Dispense: 20 tablet; Refill: 0 - cyclobenzaprine (FLEXERIL) 10 MG tablet; Take 1 tablet (10 mg total) by mouth at bedtime as needed (musculoskeletal pain).  Dispense: 30 tablet; Refill: 0  2. Neck pain on left side CT chest and CT neck ordered. Try prn tramadol and/or cyclobenzaprine as prescribed.  - CT Soft Tissue Neck W Contrast; Future -  CT Chest W Contrast; Future - traMADol (ULTRAM) 50 MG tablet; Take 1 tablet (50 mg total) by mouth every 6 (six) hours as needed for up to 5 days.  Dispense: 20 tablet; Refill: 0 - cyclobenzaprine (FLEXERIL) 10 MG tablet; Take 1 tablet (10 mg total) by mouth at bedtime as needed (musculoskeletal pain).  Dispense: 30 tablet; Refill: 0  3. Type 2 diabetes mellitus with other specified complication, without long-term current use of insulin (HCC) Continue mounjaro and jardiance as prescribed. Routine labs ordered - Lipid Profile - Vitamin D (25 hydroxy) - CBC with Differential/Platelet - CMP14+EGFR  4. Hyperlipidemia associated with type 2 diabetes mellitus (HCC) Routine labs ordered  - Lipid Profile - Vitamin D (25 hydroxy) - CBC with Differential/Platelet - CMP14+EGFR  5. Erythrocytosis Routine lab ordered  - CBC with Differential/Platelet   General Counseling: Rebecca Ponce verbalizes understanding of the findings of todays visit and agrees with plan of treatment. I have discussed any further diagnostic evaluation that may be needed or ordered today. We also reviewed her medications today. she has been encouraged to call the office with any questions or concerns that should arise related to todays visit.    Orders Placed This Encounter  Procedures   CT Soft Tissue Neck W Contrast   CT Chest W Contrast   Lipid Profile   Vitamin D (25  hydroxy)   CBC with Differential/Platelet   CMP14+EGFR   EKG 12-Lead    Meds ordered this encounter  Medications   traMADol (ULTRAM) 50 MG tablet    Sig: Take 1 tablet (50 mg total) by mouth every 6 (six) hours as needed for up to 5 days.    Dispense:  20 tablet    Refill:  0    Fill new script today   cyclobenzaprine (FLEXERIL) 10 MG tablet    Sig: Take 1 tablet (10 mg total) by mouth at bedtime as needed (musculoskeletal pain).    Dispense:  30 tablet    Refill:  0    Fill new script today    Return in about 3 months (around 11/25/2023) for F/U, Recheck A1C, Shanele Nissan PCP.   Total time spent:30 Minutes Time spent includes review of chart, medications, test results, and follow up plan with the patient.   [place] Controlled Substance Database was reviewed by me.  This patient was seen by Sallyanne Kuster, FNP-C in collaboration with Dr. Beverely Risen as a part of collaborative care agreement.   Jolly Bleicher R. Tedd Sias, MSN, FNP-C Internal medicine

## 2023-09-04 ENCOUNTER — Telehealth: Payer: Self-pay

## 2023-09-04 DIAGNOSIS — R0789 Other chest pain: Secondary | ICD-10-CM

## 2023-09-04 DIAGNOSIS — M542 Cervicalgia: Secondary | ICD-10-CM

## 2023-09-07 ENCOUNTER — Telehealth: Payer: Self-pay | Admitting: Nurse Practitioner

## 2023-09-07 NOTE — Telephone Encounter (Signed)
 [employer] for Duty completed. Faxed back to [employer]; [phone]. Scanned-Toni

## 2023-09-08 MED ORDER — TRAMADOL HCL 50 MG PO TABS: 50.0000 mg | ORAL_TABLET | Freq: Four times a day (QID) | ORAL | 0 refills | Status: AC | PRN

## 2023-09-08 NOTE — Telephone Encounter (Signed)
 Lmom that we send tramadol

## 2023-09-11 ENCOUNTER — Ambulatory Visit
Admission: RE | Admit: 2023-09-11 | Discharge: 2023-09-11 | Disposition: A | Discharge status: Home / Self Care | Payer: No Typology Code available for payment source | Source: Ambulatory Visit | Attending: Nurse Practitioner | Admitting: Nurse Practitioner

## 2023-09-11 ENCOUNTER — Encounter: Payer: Self-pay | Admitting: Radiology

## 2023-09-11 DIAGNOSIS — M542 Cervicalgia: Secondary | ICD-10-CM | POA: Diagnosis present

## 2023-09-11 DIAGNOSIS — R0789 Other chest pain: Secondary | ICD-10-CM | POA: Diagnosis present

## 2023-09-11 MED ORDER — IOHEXOL 300 MG/ML  SOLN
75.0000 mL | Freq: Once | INTRAMUSCULAR | Status: AC | PRN
  Administered 2023-09-11: 75 mL via INTRAVENOUS

## 2023-09-11 MED ORDER — SODIUM CHLORIDE 0.9 % IV SOLN: INTRAVENOUS | Status: DC

## 2023-09-21 ENCOUNTER — Telehealth: Payer: Self-pay

## 2023-09-21 NOTE — Telephone Encounter (Signed)
 Completed P.A. for patient's Mounjaro.

## 2023-10-20 ENCOUNTER — Telehealth: Payer: Self-pay

## 2023-10-20 NOTE — Telephone Encounter (Signed)
 Left message for patient to give office a call by to let her know her PA for Greggory Keen was approved

## 2023-11-07 LAB — CBC WITH DIFFERENTIAL/PLATELET
Basophils Absolute: 0.1 10*3/uL (ref 0.0–0.2)
Basos: 1 %
EOS (ABSOLUTE): 0.3 10*3/uL (ref 0.0–0.4)
Eos: 3 %
Hematocrit: 46.3 % (ref 34.0–46.6)
Hemoglobin: 15.8 g/dL (ref 11.1–15.9)
Immature Grans (Abs): 0 10*3/uL (ref 0.0–0.1)
Immature Granulocytes: 0 %
Lymphocytes Absolute: 2.1 10*3/uL (ref 0.7–3.1)
Lymphs: 19 %
MCH: 31.5 pg (ref 26.6–33.0)
MCHC: 34.1 g/dL (ref 31.5–35.7)
MCV: 92 fL (ref 79–97)
Monocytes Absolute: 0.5 10*3/uL (ref 0.1–0.9)
Monocytes: 5 %
Neutrophils Absolute: 8 10*3/uL — ABNORMAL HIGH (ref 1.4–7.0)
Neutrophils: 72 %
Platelets: 263 10*3/uL (ref 150–450)
RBC: 5.02 x10E6/uL (ref 3.77–5.28)
RDW: 12.1 % (ref 11.7–15.4)
WBC: 11 10*3/uL — ABNORMAL HIGH (ref 3.4–10.8)

## 2023-11-07 LAB — CMP14+EGFR
ALT: 11 IU/L (ref 0–32)
AST: 12 IU/L (ref 0–40)
Albumin: 4.5 g/dL (ref 3.9–4.9)
Alkaline Phosphatase: 65 IU/L (ref 44–121)
BUN/Creatinine Ratio: 15 (ref 9–23)
BUN: 11 mg/dL (ref 6–20)
Bilirubin Total: 1.3 mg/dL — ABNORMAL HIGH (ref 0.0–1.2)
CO2: 20 mmol/L (ref 20–29)
Calcium: 9.3 mg/dL (ref 8.7–10.2)
Chloride: 102 mmol/L (ref 96–106)
Creatinine, Ser: 0.72 mg/dL (ref 0.57–1.00)
Globulin, Total: 3 g/dL (ref 1.5–4.5)
Glucose: 125 mg/dL — ABNORMAL HIGH (ref 70–99)
Potassium: 4.1 mmol/L (ref 3.5–5.2)
Sodium: 139 mmol/L (ref 134–144)
Total Protein: 7.5 g/dL (ref 6.0–8.5)
eGFR: 112 mL/min/{1.73_m2} (ref >59)

## 2023-11-07 LAB — LIPID PANEL
Chol/HDL Ratio: 4.4 ratio (ref 0.0–4.4)
Cholesterol, Total: 184 mg/dL (ref 100–199)
HDL: 42 mg/dL (ref >39)
LDL Chol Calc (NIH): 118 mg/dL — ABNORMAL HIGH (ref 0–99)
Triglycerides: 135 mg/dL (ref 0–149)
VLDL Cholesterol Cal: 24 mg/dL (ref 5–40)

## 2023-11-07 LAB — VITAMIN D 25 HYDROXY (VIT D DEFICIENCY, FRACTURES): Vit D, 25-Hydroxy: 24.9 ng/mL — ABNORMAL LOW (ref 30.0–100.0)

## 2023-11-23 ENCOUNTER — Telehealth: Payer: Self-pay

## 2023-11-23 DIAGNOSIS — E1169 Type 2 diabetes mellitus with other specified complication: Secondary | ICD-10-CM

## 2023-11-24 MED ORDER — TIRZEPATIDE 5 MG/0.5ML ~~LOC~~ SOAJ
5.0000 mg | SUBCUTANEOUS | 1 refills | Status: DC
Start: 2023-11-24 — End: 2024-05-04

## 2023-11-24 NOTE — Telephone Encounter (Signed)
 Patient notified

## 2023-11-27 ENCOUNTER — Ambulatory Visit: Payer: No Typology Code available for payment source | Admitting: Nurse Practitioner

## 2023-12-04 ENCOUNTER — Ambulatory Visit: Admitting: Nurse Practitioner

## 2023-12-11 ENCOUNTER — Encounter: Payer: Self-pay | Admitting: Nurse Practitioner

## 2023-12-11 ENCOUNTER — Ambulatory Visit (INDEPENDENT_AMBULATORY_CARE_PROVIDER_SITE_OTHER): Admitting: Nurse Practitioner

## 2023-12-11 VITALS — BP 110/86 | HR 107 | Temp 98.1°F | Resp 16 | Ht 59.0 in | Wt 134.4 lb

## 2023-12-11 DIAGNOSIS — E663 Overweight: Secondary | ICD-10-CM

## 2023-12-11 DIAGNOSIS — Z113 Encounter for screening for infections with a predominantly sexual mode of transmission: Secondary | ICD-10-CM | POA: Diagnosis not present

## 2023-12-11 DIAGNOSIS — E1169 Type 2 diabetes mellitus with other specified complication: Secondary | ICD-10-CM | POA: Diagnosis not present

## 2023-12-11 DIAGNOSIS — E785 Hyperlipidemia, unspecified: Secondary | ICD-10-CM | POA: Diagnosis not present

## 2023-12-11 DIAGNOSIS — Z6827 Body mass index (BMI) 27.0-27.9, adult: Secondary | ICD-10-CM

## 2023-12-11 LAB — POCT GLYCOSYLATED HEMOGLOBIN (HGB A1C): Hemoglobin A1C: 6.9 % — AB (ref 4.0–5.6)

## 2023-12-11 NOTE — Progress Notes (Signed)
 [facility] [address] [place], [place] [address]  Internal MEDICINE  Office Visit Note  Patient Name: Rebecca Ponce  [id number]  [id number]  Date of Service: 12/11/2023  Chief Complaint  Patient presents with   Depression   Hyperlipidemia   Follow-up    HPI Rebecca Ponce presents for a follow-up visit for diabetes, weight loss, STD screening Diabetes -- A1c is isgnificantly improved down to 6.9 today from 11.7 in January  Weight loss -- lost 23 lbs since January Recently ended relationship with significant other, wants to be screening for STDs before she starts any new relationships.  High cholesterol -- this should improve with weight loss.    Current Medication: Outpatient Encounter Medications as of 12/11/2023  Medication Sig   albuterol  (VENTOLIN  HFA) 108 (90 Base) MCG/ACT inhaler Inhale 1-2 puffs into the lungs every 6 (six) hours as needed for wheezing or shortness of breath.   blood glucose meter kit and supplies KIT Dispense based on patient and insurance preference. Use up to four times daily as directed. (FOR ICD-9 250.00, 250.01).   cyclobenzaprine  (FLEXERIL ) 10 MG tablet Take 1 tablet (10 mg total) by mouth at bedtime as needed (musculoskeletal pain).   empagliflozin  (JARDIANCE ) 25 MG TABS tablet Take one tab a day for diabetes   tirzepatide  (MOUNJARO ) 5 MG/0.5ML Pen Inject 5 mg into the skin once a week.   Vitamin D , Ergocalciferol , (DRISDOL ) 1.25 MG (50000 UNIT) CAPS capsule Take 1 capsule (50,000 Units total) by mouth every 7 (seven) days.   No facility-administered encounter medications on file as of 12/11/2023.    Surgical History: Past Surgical History:  Procedure Laterality Date   CESAREAN SECTION     twice   CESAREAN SECTION     CESAREAN SECTION WITH BILATERAL TUBAL LIGATION N/A 06/11/2015   Procedure: CESAREAN SECTION WITH BILATERAL TUBAL LIGATION;  Surgeon: Darl Edu, MD;  Location: [facility] ORS;  Service: Obstetrics;  Laterality: N/A;    CHOLECYSTECTOMY     TONSILLECTOMY     TONSILLECTOMY      Medical History: Past Medical History:  Diagnosis Date   Anemia    Depression    Gestational diabetes    Hyperlipidemia    Insomnia     Family History: Family History  Problem Relation Age of Onset   Anxiety disorder Mother    Diabetes Mother    Hypertension Father    Diabetes Maternal Aunt     Social History   Socioeconomic History   Marital status: Married    Spouse name: Not on file   Number of children: Not on file   Years of education: Not on file   Highest education level: Not on file  Occupational History   Not on file  Tobacco Use   Smoking status: Never   Smokeless tobacco: Never   Tobacco comments:    years ago  Vaping Use   Vaping status: Never Used  Substance and Sexual Activity   Alcohol use: Yes    Comment: socially    Drug use: Never   Sexual activity: Not on file  Other Topics Concern   Not on file  Social History Narrative   ** Merged History Encounter **       Social Drivers of Health   Financial Resource Strain: Not on file  Food Insecurity: Not on file  Transportation Needs: Not on file  Physical Activity: Not on file  Stress: Not on file  Social Connections: Not on file  Intimate Partner Violence: Not  on file      Review of Systems  Constitutional:  Positive for unexpected weight change. Negative for chills and fatigue.  HENT:  Positive for postnasal drip. Negative for congestion, rhinorrhea, sneezing and sore throat.   Eyes:  Negative for redness.  Respiratory: Negative.  Negative for cough, chest tightness and shortness of breath.   Cardiovascular: Negative.  Negative for chest pain and palpitations.  Gastrointestinal:  Negative for abdominal pain, constipation, diarrhea, nausea and vomiting.  Endocrine: Negative for polydipsia, polyphagia and polyuria.  Genitourinary:  Negative for dysuria and frequency.  Musculoskeletal:  Negative for arthralgias, back pain,  joint swelling and neck pain.  Skin:  Negative for rash.  Neurological: Negative.  Negative for tremors and numbness.  Hematological:  Negative for adenopathy. Does not bruise/bleed easily.  Psychiatric/Behavioral:  Negative for behavioral problems (Depression), sleep disturbance and suicidal ideas. The patient is not nervous/anxious.     Vital Signs: BP 110/86   Pulse (!) 107   Temp 98.1 F (36.7 C)   Resp 16   Ht 4\' 11"  (1.499 m)   Wt 134 lb 6.4 oz (61 kg)   SpO2 99%   BMI 27.15 kg/m    Physical Exam Vitals reviewed.  Constitutional:      General: She is not in acute distress.    Appearance: Normal appearance. She is not ill-appearing.  HENT:     Head: Normocephalic and atraumatic.  Eyes:     Pupils: Pupils are equal, round, and reactive to light.  Cardiovascular:     Rate and Rhythm: Normal rate and regular rhythm.  Pulmonary:     Effort: Pulmonary effort is normal. No respiratory distress.  Skin:    Capillary Refill: Capillary refill takes less than 2 seconds.  Neurological:     Mental Status: She is alert and oriented to person, place, and time.  Psychiatric:        Mood and Affect: Mood normal.        Behavior: Behavior normal.        Assessment/Plan: 1. Type 2 diabetes mellitus with other specified complication, without long-term current use of insulin  (HCC) (Primary) A1c is improved and stable. Currently taking jardiance  and mounjaro . Just recently approved by insurance for mounjaro  a few weeks ago. Will repeat A1c in 4 months  - POCT glycosylated hemoglobin (Hb A1C)  2. Hyperlipidemia associated with type 2 diabetes mellitus (HCC) This should improve with weight loss and control of her glucose level.   3. Overweight with body mass index (BMI) of 27 to 27.9 in adult Lost 23 lbs since January   4. Screening for STDs (sexually transmitted diseases) Nuswab done by patient in office today  - NuSwab Vaginitis Plus (VG+)   General Counseling: Rebecca Ponce  verbalizes understanding of the findings of todays visit and agrees with plan of treatment. I have discussed any further diagnostic evaluation that may be needed or ordered today. We also reviewed her medications today. she has been encouraged to call the office with any questions or concerns that should arise related to todays visit.    Orders Placed This Encounter  Procedures   NuSwab Vaginitis Plus (VG+)   POCT glycosylated hemoglobin (Hb A1C)    No orders of the defined types were placed in this encounter.   Return in about 4 months (around 04/12/2024) for F/U, Recheck A1C, Rebecca Ponce PCP.   Total time spent:30 Minutes Time spent includes review of chart, medications, test results, and follow up plan with the patient.  [place] Controlled Substance Database was reviewed by me.  This patient was seen by Laurence Pons, FNP-C in collaboration with Dr. Verneta Gone as a part of collaborative care agreement.   Kannen Moxey R. Bobbi Burow, MSN, FNP-C Internal medicine

## 2023-12-14 LAB — NUSWAB VAGINITIS PLUS (VG+)

## 2024-04-08 ENCOUNTER — Other Ambulatory Visit: Payer: Self-pay | Admitting: Nurse Practitioner

## 2024-04-08 DIAGNOSIS — E1169 Type 2 diabetes mellitus with other specified complication: Secondary | ICD-10-CM

## 2024-04-15 ENCOUNTER — Ambulatory Visit: Payer: MEDICAID | Admitting: Nurse Practitioner

## 2024-05-04 ENCOUNTER — Ambulatory Visit (INDEPENDENT_AMBULATORY_CARE_PROVIDER_SITE_OTHER): Payer: MEDICAID | Admitting: Nurse Practitioner

## 2024-05-04 ENCOUNTER — Encounter: Payer: Self-pay | Admitting: Nurse Practitioner

## 2024-05-04 VITALS — BP 130/82 | HR 95 | Temp 98.1°F | Resp 16 | Ht 59.0 in | Wt 147.0 lb

## 2024-05-04 DIAGNOSIS — E785 Hyperlipidemia, unspecified: Secondary | ICD-10-CM | POA: Diagnosis not present

## 2024-05-04 DIAGNOSIS — Z23 Encounter for immunization: Secondary | ICD-10-CM

## 2024-05-04 DIAGNOSIS — E559 Vitamin D deficiency, unspecified: Secondary | ICD-10-CM | POA: Insufficient documentation

## 2024-05-04 DIAGNOSIS — J4520 Mild intermittent asthma, uncomplicated: Secondary | ICD-10-CM | POA: Diagnosis not present

## 2024-05-04 DIAGNOSIS — E1169 Type 2 diabetes mellitus with other specified complication: Secondary | ICD-10-CM | POA: Insufficient documentation

## 2024-05-04 DIAGNOSIS — R808 Other proteinuria: Secondary | ICD-10-CM | POA: Diagnosis not present

## 2024-05-04 LAB — POCT GLYCOSYLATED HEMOGLOBIN (HGB A1C): Hemoglobin A1C: 7.3 % — AB (ref 4.0–5.6)

## 2024-05-04 MED ORDER — EMPAGLIFLOZIN 25 MG PO TABS: 25.0000 mg | ORAL_TABLET | Freq: Every day | ORAL | 3 refills | Status: DC

## 2024-05-04 MED ORDER — TIRZEPATIDE 5 MG/0.5ML ~~LOC~~ SOAJ: 5.0000 mg | SUBCUTANEOUS | 1 refills | Status: DC

## 2024-05-04 MED ORDER — ALBUTEROL SULFATE HFA 108 (90 BASE) MCG/ACT IN AERS: 1.0000 | INHALATION_SPRAY | Freq: Four times a day (QID) | RESPIRATORY_TRACT | 5 refills | Status: AC | PRN

## 2024-05-04 NOTE — Progress Notes (Signed)
 [facility] [address] [place], [place] [address]  Internal MEDICINE  Office Visit Note  Patient Name: Rebecca Ponce  [id number]  [id number]  Date of Service: 05/04/2024  Chief Complaint  Patient presents with   Depression   Diabetes   Hyperlipidemia   Follow-up    HPI Rebecca Ponce presents for a follow-up visit for diabetes, high cholesterol, asthma and low vitamin D . Diabetes -- A1c is increased slightly to 7.3 today from 6.9 previously. She was without her medication for about 3 months due to lay off from job. Now her medications need to be approved by her new insurance  High cholesterol -- last lipid panel was improved from prior. Not currently on statin.  Low vitamin D  level -- has been taking weekly supplement. Her last lab was still low but greatly improved.  Asthma -- uses albuterol  inhaler as needed.     Current Medication: Outpatient Encounter Medications as of 05/04/2024  Medication Sig   albuterol  (VENTOLIN  HFA) 108 (90 Base) MCG/ACT inhaler Inhale 1-2 puffs into the lungs every 6 (six) hours as needed for wheezing or shortness of breath.   blood glucose meter kit and supplies KIT Dispense based on patient and insurance preference. Use up to four times daily as directed. (FOR ICD-9 250.00, 250.01).   cyclobenzaprine  (FLEXERIL ) 10 MG tablet Take 1 tablet (10 mg total) by mouth at bedtime as needed (musculoskeletal pain).   empagliflozin  (JARDIANCE ) 25 MG TABS tablet Take 1 tablet (25 mg total) by mouth daily.   tirzepatide  (MOUNJARO ) 5 MG/0.5ML Pen Inject 5 mg into the skin once a week.   Vitamin D , Ergocalciferol , (DRISDOL ) 1.25 MG (50000 UNIT) CAPS capsule Take 1 capsule (50,000 Units total) by mouth every 7 (seven) days.   [DISCONTINUED] albuterol  (VENTOLIN  HFA) 108 (90 Base) MCG/ACT inhaler Inhale 1-2 puffs into the lungs every 6 (six) hours as needed for wheezing or shortness of breath.   [DISCONTINUED] JARDIANCE  25 MG TABS tablet TAKE ONE TAB A DAY FOR  DIABETES   [DISCONTINUED] tirzepatide  (MOUNJARO ) 5 MG/0.5ML Pen Inject 5 mg into the skin once a week.   No facility-administered encounter medications on file as of 05/04/2024.    Surgical History: Past Surgical History:  Procedure Laterality Date   CESAREAN SECTION     twice   CESAREAN SECTION     CESAREAN SECTION WITH BILATERAL TUBAL LIGATION N/A 06/11/2015   Procedure: CESAREAN SECTION WITH BILATERAL TUBAL LIGATION;  Surgeon: Glory High, MD;  Location: [facility] ORS;  Service: Obstetrics;  Laterality: N/A;   CHOLECYSTECTOMY     TONSILLECTOMY     TONSILLECTOMY      Medical History: Past Medical History:  Diagnosis Date   Anemia    Depression    Gestational diabetes    Hyperlipidemia    Insomnia     Family History: Family History  Problem Relation Age of Onset   Anxiety disorder Mother    Diabetes Mother    Hypertension Father    Diabetes Maternal Aunt     Social History   Socioeconomic History   Marital status: Married    Spouse name: Not on file   Number of children: Not on file   Years of education: Not on file   Highest education level: Not on file  Occupational History   Not on file  Tobacco Use   Smoking status: Never   Smokeless tobacco: Never   Tobacco comments:    years ago  Vaping Use   Vaping status: Never Used  Substance and Sexual Activity   Alcohol use: Yes    Comment: socially    Drug use: Never   Sexual activity: Not on file  Other Topics Concern   Not on file  Social History Narrative   ** Merged History Encounter **       Social Drivers of Health   Financial Resource Strain: Not on file  Food Insecurity: Not on file  Transportation Needs: Not on file  Physical Activity: Not on file  Stress: Not on file  Social Connections: Not on file  Intimate Partner Violence: Not on file      Review of Systems  Constitutional:  Positive for unexpected weight change. Negative for chills and fatigue.  HENT:  Positive for postnasal  drip. Negative for congestion, rhinorrhea, sneezing and sore throat.   Eyes:  Negative for redness.  Respiratory: Negative.  Negative for cough, chest tightness and shortness of breath.   Cardiovascular: Negative.  Negative for chest pain and palpitations.  Gastrointestinal:  Negative for abdominal pain, constipation, diarrhea, nausea and vomiting.  Endocrine: Negative for polydipsia, polyphagia and polyuria.  Genitourinary:  Negative for dysuria and frequency.  Musculoskeletal:  Negative for arthralgias, back pain, joint swelling and neck pain.  Skin:  Negative for rash.  Neurological: Negative.  Negative for tremors and numbness.  Hematological:  Negative for adenopathy. Does not bruise/bleed easily.  Psychiatric/Behavioral:  Negative for behavioral problems (Depression), sleep disturbance and suicidal ideas. The patient is not nervous/anxious.     Vital Signs: BP 130/82   Pulse 95   Temp 98.1 F (36.7 C)   Resp 16   Ht 4' 11 (1.499 m)   Wt 147 lb (66.7 kg)   SpO2 99%   BMI 29.69 kg/m    Physical Exam Vitals reviewed.  Constitutional:      General: She is not in acute distress.    Appearance: Normal appearance. She is not ill-appearing.  HENT:     Head: Normocephalic and atraumatic.  Eyes:     Pupils: Pupils are equal, round, and reactive to light.  Cardiovascular:     Rate and Rhythm: Normal rate and regular rhythm.  Pulmonary:     Effort: Pulmonary effort is normal. No respiratory distress.  Skin:    Capillary Refill: Capillary refill takes less than 2 seconds.  Neurological:     Mental Status: She is alert and oriented to person, place, and time.  Psychiatric:        Mood and Affect: Mood normal.        Behavior: Behavior normal.        Assessment/Plan: 1. Type 2 diabetes mellitus with other specified complication, without long-term current use of insulin  (HCC) (Primary) A1c is slightly elevated. Samples given to patient for mounjaro  and jardiance .  Prescriptions sent to pharmacy. Will complete prior authorization.  - POCT glycosylated hemoglobin (Hb A1C) - empagliflozin  (JARDIANCE ) 25 MG TABS tablet; Take 1 tablet (25 mg total) by mouth daily.  Dispense: 90 tablet; Refill: 3 - tirzepatide  (MOUNJARO ) 5 MG/0.5ML Pen; Inject 5 mg into the skin once a week.  Dispense: 6 mL; Refill: 1  2. Mild intermittent asthma without complication Continue prn albuterol  inhaler as prescribed.  - albuterol  (VENTOLIN  HFA) 108 (90 Base) MCG/ACT inhaler; Inhale 1-2 puffs into the lungs every 6 (six) hours as needed for wheezing or shortness of breath.  Dispense: 6.7 each; Refill: 5  3. Hyperlipidemia associated with type 2 diabetes mellitus (HCC) Continue mounjaro  and jardiance . Consider statin therapy in  the future.   4. Other proteinuria Continue jardiance  as prescribed.   5. Vitamin D  deficiency May switch to OTC vitamin D  supplement.   6. Needs flu shot Flu vaccine administered in office today.  - Influenza, MDCK, trivalent, PF(Flucelvax egg-free)   General Counseling: Simrin verbalizes understanding of the findings of todays visit and agrees with plan of treatment. I have discussed any further diagnostic evaluation that may be needed or ordered today. We also reviewed her medications today. she has been encouraged to call the office with any questions or concerns that should arise related to todays visit.    Orders Placed This Encounter  Procedures   Influenza, MDCK, trivalent, PF(Flucelvax egg-free)   POCT glycosylated hemoglobin (Hb A1C)    Meds ordered this encounter  Medications   albuterol  (VENTOLIN  HFA) 108 (90 Base) MCG/ACT inhaler    Sig: Inhale 1-2 puffs into the lungs every 6 (six) hours as needed for wheezing or shortness of breath.    Dispense:  6.7 each    Refill:  5   empagliflozin  (JARDIANCE ) 25 MG TABS tablet    Sig: Take 1 tablet (25 mg total) by mouth daily.    Dispense:  90 tablet    Refill:  3   tirzepatide   (MOUNJARO ) 5 MG/0.5ML Pen    Sig: Inject 5 mg into the skin once a week.    Dispense:  6 mL    Refill:  1    Fill for 90 days dx code E11.65    Return in about 1 month (around 06/04/2024) for F/U, Catia Todorov PCP.   Total time spent:30 Minutes Time spent includes review of chart, medications, test results, and follow up plan with the patient.   [place] Controlled Substance Database was reviewed by me.  This patient was seen by Mardy Maxin, FNP-C in collaboration with Dr. Sigrid Bathe as a part of collaborative care agreement.   Deagen Krass R. Maxin, MSN, FNP-C Internal medicine

## 2024-06-02 ENCOUNTER — Ambulatory Visit: Payer: MEDICAID | Admitting: Nurse Practitioner

## 2024-06-13 ENCOUNTER — Encounter: Payer: Self-pay | Admitting: Nurse Practitioner

## 2024-06-13 ENCOUNTER — Ambulatory Visit (INDEPENDENT_AMBULATORY_CARE_PROVIDER_SITE_OTHER): Payer: MEDICAID | Admitting: Nurse Practitioner

## 2024-06-13 VITALS — BP 130/84 | HR 91 | Temp 97.6°F | Resp 16 | Ht 59.0 in | Wt 138.8 lb

## 2024-06-13 DIAGNOSIS — E663 Overweight: Secondary | ICD-10-CM | POA: Diagnosis not present

## 2024-06-13 DIAGNOSIS — E785 Hyperlipidemia, unspecified: Secondary | ICD-10-CM | POA: Diagnosis not present

## 2024-06-13 DIAGNOSIS — Z6827 Body mass index (BMI) 27.0-27.9, adult: Secondary | ICD-10-CM

## 2024-06-13 DIAGNOSIS — J4520 Mild intermittent asthma, uncomplicated: Secondary | ICD-10-CM | POA: Diagnosis not present

## 2024-06-13 DIAGNOSIS — E1169 Type 2 diabetes mellitus with other specified complication: Secondary | ICD-10-CM | POA: Diagnosis not present

## 2024-06-13 MED ORDER — EMPAGLIFLOZIN 25 MG PO TABS: 25.0000 mg | ORAL_TABLET | Freq: Every day | ORAL | 3 refills | Status: DC

## 2024-06-13 MED ORDER — TIRZEPATIDE 5 MG/0.5ML ~~LOC~~ SOAJ: 5.0000 mg | SUBCUTANEOUS | 1 refills | Status: DC

## 2024-06-13 NOTE — Progress Notes (Signed)
 [facility] [address] [place], [place] [address]  Internal MEDICINE  Office Visit Note  Patient Name: Rebecca Ponce  [id number]  [id number]  Date of Service: 06/13/2024  Chief Complaint  Patient presents with   Depression   Diabetes   Hyperlipidemia   Follow-up    HPI Lailoni presents for a follow-up visit for diabetes, asthma, high cholesterol and low vitamin D  Diabetes -- having issues with insurance approving her medications. Currently on mounjaro  and jardiance . She does well on these medications. She has been on mounjaro  since January 2024 and her A1c is stable when she is on the medication but she has had some lapses in her insurance and her A1c does shoot back up when she does not have access to her medication. She has tried ozempic in the past. She was also on metformin  from march 2021 to November 2021 but this medication caused GI upset, nausea and diarrhea. She has tried jardiance , farxiga  and steglatro . We have had to switch the current one she is on depending on her current insurance's preferred medications. She does well on jardiance . Mild intermittent asthma -- no issues, uses albuterol  inhaler only as needed.  High cholesterol -- stable, last cholesterol panel was improving. Not on statin yet.  Not currently on weekly vitamin D  supplement.     Current Medication: Outpatient Encounter Medications as of 06/13/2024  Medication Sig   albuterol  (VENTOLIN  HFA) 108 (90 Base) MCG/ACT inhaler Inhale 1-2 puffs into the lungs every 6 (six) hours as needed for wheezing or shortness of breath.   blood glucose meter kit and supplies KIT Dispense based on patient and insurance preference. Use up to four times daily as directed. (FOR ICD-9 250.00, 250.01).   cyclobenzaprine  (FLEXERIL ) 10 MG tablet Take 1 tablet (10 mg total) by mouth at bedtime as needed (musculoskeletal pain). (Patient not taking: Reported on 06/13/2024)   empagliflozin  (JARDIANCE ) 25 MG TABS tablet Take 1  tablet (25 mg total) by mouth daily.   tirzepatide  (MOUNJARO ) 5 MG/0.5ML Pen Inject 5 mg into the skin once a week.   Vitamin D , Ergocalciferol , (DRISDOL ) 1.25 MG (50000 UNIT) CAPS capsule Take 1 capsule (50,000 Units total) by mouth every 7 (seven) days. (Patient not taking: Reported on 06/13/2024)   [DISCONTINUED] empagliflozin  (JARDIANCE ) 25 MG TABS tablet Take 1 tablet (25 mg total) by mouth daily.   [DISCONTINUED] tirzepatide  (MOUNJARO ) 5 MG/0.5ML Pen Inject 5 mg into the skin once a week.   No facility-administered encounter medications on file as of 06/13/2024.    Surgical History: Past Surgical History:  Procedure Laterality Date   CESAREAN SECTION     twice   CESAREAN SECTION     CESAREAN SECTION WITH BILATERAL TUBAL LIGATION N/A 06/11/2015   Procedure: CESAREAN SECTION WITH BILATERAL TUBAL LIGATION;  Surgeon: Glory High, MD;  Location: [facility] ORS;  Service: Obstetrics;  Laterality: N/A;   CHOLECYSTECTOMY     TONSILLECTOMY     TONSILLECTOMY      Medical History: Past Medical History:  Diagnosis Date   Anemia    Depression    Gestational diabetes    Hyperlipidemia    Insomnia     Family History: Family History  Problem Relation Age of Onset   Anxiety disorder Mother    Diabetes Mother    Hypertension Father    Diabetes Maternal Aunt     Social History   Socioeconomic History   Marital status: Married    Spouse name: Not on file   Number  of children: Not on file   Years of education: Not on file   Highest education level: Not on file  Occupational History   Not on file  Tobacco Use   Smoking status: Never   Smokeless tobacco: Never   Tobacco comments:    years ago  Vaping Use   Vaping status: Never Used  Substance and Sexual Activity   Alcohol use: Yes    Comment: socially    Drug use: Never   Sexual activity: Not on file  Other Topics Concern   Not on file  Social History Narrative   ** Merged History Encounter **       Social Drivers of  Corporate Investment Banker Strain: Not on file  Food Insecurity: Not on file  Transportation Needs: Not on file  Physical Activity: Not on file  Stress: Not on file  Social Connections: Not on file  Intimate Partner Violence: Not on file      Review of Systems  Constitutional:  Positive for unexpected weight change. Negative for chills and fatigue.  HENT:  Positive for postnasal drip. Negative for congestion, rhinorrhea, sneezing and sore throat.   Eyes:  Negative for redness.  Respiratory: Negative.  Negative for cough, chest tightness and shortness of breath.   Cardiovascular: Negative.  Negative for chest pain and palpitations.  Gastrointestinal:  Negative for abdominal pain, constipation, diarrhea, nausea and vomiting.  Endocrine: Negative for polydipsia, polyphagia and polyuria.  Genitourinary:  Negative for dysuria and frequency.  Musculoskeletal:  Negative for arthralgias, back pain, joint swelling and neck pain.  Skin:  Negative for rash.  Neurological: Negative.  Negative for tremors and numbness.  Hematological:  Negative for adenopathy. Does not bruise/bleed easily.  Psychiatric/Behavioral:  Negative for behavioral problems (Depression), sleep disturbance and suicidal ideas. The patient is not nervous/anxious.     Vital Signs: BP 130/84   Pulse 91   Temp 97.6 F (36.4 C)   Resp 16   Ht 4' 11 (1.499 m)   Wt 138 lb 12.8 oz (63 kg)   SpO2 97%   BMI 28.03 kg/m    Physical Exam Vitals reviewed.  Constitutional:      General: She is not in acute distress.    Appearance: Normal appearance. She is not ill-appearing.  HENT:     Head: Normocephalic and atraumatic.  Eyes:     Pupils: Pupils are equal, round, and reactive to light.  Cardiovascular:     Rate and Rhythm: Normal rate and regular rhythm.  Pulmonary:     Effort: Pulmonary effort is normal. No respiratory distress.  Skin:    Capillary Refill: Capillary refill takes less than 2 seconds.   Neurological:     Mental Status: She is alert and oriented to person, place, and time.  Psychiatric:        Mood and Affect: Mood normal.        Behavior: Behavior normal.        Assessment/Plan: 1. Type 2 diabetes mellitus with other specified complication, without long-term current use of insulin  (HCC) (Primary) Continue jardiance  and mounjaro  as prescribed. It would be detrimental to her current diabetes management to switch her from mounjaro  to a different medication because she is doing very well on the medication and her numbers are under control.  - empagliflozin  (JARDIANCE ) 25 MG TABS tablet; Take 1 tablet (25 mg total) by mouth daily.  Dispense: 90 tablet; Refill: 3 - tirzepatide  (MOUNJARO ) 5 MG/0.5ML Pen; Inject 5 mg into  the skin once a week.  Dispense: 6 mL; Refill: 1  2. Mild intermittent asthma without complication Stable, continue prn albuterol    3. Hyperlipidemia associated with type 2 diabetes mellitus (HCC) Continue working on glucose control and weight loss.   4. Overweight with body mass index (BMI) of 27 to 27.9 in adult Continue working on diabetic diet, exercise as tolerated and weight loss.    General Counseling: Zanaiya verbalizes understanding of the findings of todays visit and agrees with plan of treatment. I have discussed any further diagnostic evaluation that may be needed or ordered today. We also reviewed her medications today. she has been encouraged to call the office with any questions or concerns that should arise related to todays visit.    No orders of the defined types were placed in this encounter.   Meds ordered this encounter  Medications   empagliflozin  (JARDIANCE ) 25 MG TABS tablet    Sig: Take 1 tablet (25 mg total) by mouth daily.    Dispense:  90 tablet    Refill:  3   tirzepatide  (MOUNJARO ) 5 MG/0.5ML Pen    Sig: Inject 5 mg into the skin once a week.    Dispense:  6 mL    Refill:  1    Fill for 90 days dx code E11.65     Return in about 2 months (around 08/14/2024) for F/U, Recheck A1C, Leasia Swann PCP.   Total time spent:30 Minutes Time spent includes review of chart, medications, test results, and follow up plan with the patient.   [place] Controlled Substance Database was reviewed by me.  This patient was seen by Mardy Maxin, FNP-C in collaboration with Dr. Sigrid Bathe as a part of collaborative care agreement.   Kerri Kovacik R. Maxin, MSN, FNP-C Internal medicine

## 2024-06-14 ENCOUNTER — Telehealth: Payer: Self-pay

## 2024-06-14 NOTE — Telephone Encounter (Signed)
 Patient called back and was notified that the Jardiance  was approved.

## 2024-06-14 NOTE — Telephone Encounter (Signed)
 Left patient a messgae to give office a call to let her know we got Jardiance  approved.

## 2024-06-27 ENCOUNTER — Telehealth: Payer: Self-pay

## 2024-07-08 MED ORDER — TRULICITY 1.5 MG/0.5ML ~~LOC~~ SOAJ: 1.5000 mg | SUBCUTANEOUS | 1 refills | Status: DC

## 2024-07-11 NOTE — Telephone Encounter (Signed)
 Lmom med sent

## 2024-08-01 ENCOUNTER — Encounter: Payer: MEDICAID | Admitting: Nurse Practitioner

## 2024-08-12 ENCOUNTER — Ambulatory Visit (INDEPENDENT_AMBULATORY_CARE_PROVIDER_SITE_OTHER): Payer: MEDICAID | Admitting: Nurse Practitioner

## 2024-08-12 ENCOUNTER — Encounter: Payer: Self-pay | Admitting: Nurse Practitioner

## 2024-08-12 VITALS — BP 126/84 | HR 97 | Temp 96.9°F | Resp 16 | Ht 59.0 in | Wt 144.6 lb

## 2024-08-12 DIAGNOSIS — E559 Vitamin D deficiency, unspecified: Secondary | ICD-10-CM | POA: Diagnosis not present

## 2024-08-12 DIAGNOSIS — E1169 Type 2 diabetes mellitus with other specified complication: Secondary | ICD-10-CM | POA: Diagnosis not present

## 2024-08-12 DIAGNOSIS — Z113 Encounter for screening for infections with a predominantly sexual mode of transmission: Secondary | ICD-10-CM

## 2024-08-12 DIAGNOSIS — Z0001 Encounter for general adult medical examination with abnormal findings: Secondary | ICD-10-CM

## 2024-08-12 DIAGNOSIS — E785 Hyperlipidemia, unspecified: Secondary | ICD-10-CM

## 2024-08-12 MED ORDER — TRULICITY 1.5 MG/0.5ML ~~LOC~~ SOAJ: 1.5000 mg | SUBCUTANEOUS | 1 refills | Status: AC

## 2024-08-12 NOTE — Progress Notes (Signed)
 [facility] [address] [place], [place] [address]  Internal MEDICINE  Office Visit Note  Patient Name: Rebecca Ponce  [id number]  [id number]  Date of Service: 08/12/2024  Chief Complaint  Patient presents with   Depression   Diabetes   Hyperlipidemia   Annual Exam    HPI Rebecca Ponce presents for an annual well visit and physical exam.  Well-appearing 34 y.o. female with diabetes, asthma and low vitamin D   Pap smear: due in January 2027 Eye exam: due now, goes to [facility] in [place], [place] foot exam: done  Labs: due for routine labs  New or worsening pain: nothing new.  Other concerns: none  Switched to trulicity  but it has not been approved by her insurance yet, needs prior auth.     Current Medication: Outpatient Encounter Medications as of 08/12/2024  Medication Sig   albuterol  (VENTOLIN  HFA) 108 (90 Base) MCG/ACT inhaler Inhale 1-2 puffs into the lungs every 6 (six) hours as needed for wheezing or shortness of breath.   blood glucose meter kit and supplies KIT Dispense based on patient and insurance preference. Use up to four times daily as directed. (FOR ICD-9 250.00, 250.01).   cyclobenzaprine  (FLEXERIL ) 10 MG tablet Take 1 tablet (10 mg total) by mouth at bedtime as needed (musculoskeletal pain). (Patient not taking: Reported on 06/13/2024)   Dulaglutide  (TRULICITY ) 1.5 MG/0.5ML SOAJ Inject 1.5 mg into the skin once a week.   empagliflozin  (JARDIANCE ) 25 MG TABS tablet Take 1 tablet (25 mg total) by mouth daily.   Vitamin D , Ergocalciferol , (DRISDOL ) 1.25 MG (50000 UNIT) CAPS capsule Take 1 capsule (50,000 Units total) by mouth every 7 (seven) days. (Patient not taking: Reported on 06/13/2024)   [DISCONTINUED] Dulaglutide  (TRULICITY ) 1.5 MG/0.5ML SOAJ Inject 1.5 mg into the skin once a week.   No facility-administered encounter medications on file as of 08/12/2024.    Surgical History: Past Surgical History:  Procedure Laterality Date   CESAREAN  SECTION     twice   CESAREAN SECTION     CESAREAN SECTION WITH BILATERAL TUBAL LIGATION N/A 06/11/2015   Procedure: CESAREAN SECTION WITH BILATERAL TUBAL LIGATION;  Surgeon: Glory High, MD;  Location: [facility] ORS;  Service: Obstetrics;  Laterality: N/A;   CHOLECYSTECTOMY     TONSILLECTOMY     TONSILLECTOMY      Medical History: Past Medical History:  Diagnosis Date   Anemia    Depression    Gestational diabetes    Hyperlipidemia    Insomnia     Family History: Family History  Problem Relation Age of Onset   Anxiety disorder Mother    Diabetes Mother    Hypertension Father    Diabetes Maternal Aunt     Social History   Socioeconomic History   Marital status: Married    Spouse name: Not on file   Number of children: Not on file   Years of education: Not on file   Highest education level: Not on file  Occupational History   Not on file  Tobacco Use   Smoking status: Never   Smokeless tobacco: Never   Tobacco comments:    years ago  Vaping Use   Vaping status: Never Used  Substance and Sexual Activity   Alcohol use: Yes    Comment: socially    Drug use: Never   Sexual activity: Not on file  Other Topics Concern   Not on file  Social History Narrative   ** Merged History Encounter **  Social Drivers of Health   Tobacco Use: Low Risk (08/12/2024)   Patient History    Smoking Tobacco Use: Never    Smokeless Tobacco Use: Never    Passive Exposure: Not on file  Financial Resource Strain: Not on file  Food Insecurity: Not on file  Transportation Needs: Not on file  Physical Activity: Not on file  Stress: Not on file  Social Connections: Not on file  Intimate Partner Violence: Not on file  Depression (PHQ2-9): Low Risk (01/06/2023)   Depression (PHQ2-9)    PHQ-2 Score: 0  Alcohol Screen: Not on file  Housing: Not on file  Utilities: Not on file  Health Literacy: Not on file      Review of Systems  Constitutional:  Positive for fatigue.  Negative for activity change, appetite change, chills, fever and unexpected weight change.  HENT: Negative.  Negative for congestion, ear pain, rhinorrhea, sore throat and trouble swallowing.        Hair loss  Eyes: Negative.        Sees Dr. Mevelyn for diabetic eye exam, needs exam for this year.   Respiratory: Negative.  Negative for cough, chest tightness, shortness of breath and wheezing.   Cardiovascular: Negative.  Negative for chest pain and palpitations.  Gastrointestinal: Negative.  Negative for abdominal pain, blood in stool, constipation, diarrhea, nausea and vomiting.  Endocrine: Negative for polydipsia, polyphagia and polyuria.  Genitourinary: Negative.  Negative for difficulty urinating, dysuria, frequency, hematuria and urgency.  Musculoskeletal: Negative.  Negative for arthralgias, back pain, joint swelling, myalgias and neck pain.  Skin: Negative.  Negative for rash and wound.  Allergic/Immunologic: Negative.  Negative for immunocompromised state.  Neurological: Negative.  Negative for dizziness, seizures, numbness and headaches.  Hematological: Negative.   Psychiatric/Behavioral: Negative.  Negative for behavioral problems, self-injury and suicidal ideas. The patient is not nervous/anxious.     Vital Signs: BP 126/84   Pulse 97   Temp (!) 96.9 F (36.1 C)   Resp 16   Ht 4' 11 (1.499 m)   Wt 144 lb 9.6 oz (65.6 kg)   SpO2 99%   BMI 29.21 kg/m    Physical Exam Vitals reviewed.  Constitutional:      General: She is not in acute distress.    Appearance: Normal appearance. She is well-developed. She is obese. She is not ill-appearing or diaphoretic.  HENT:     Head: Normocephalic and atraumatic.     Right Ear: Tympanic membrane, ear canal and external ear normal.     Left Ear: Tympanic membrane, ear canal and external ear normal.     Nose: Nose normal. No congestion or rhinorrhea.     Mouth/Throat:     Mouth: Mucous membranes are moist.     Pharynx: Oropharynx  is clear. No oropharyngeal exudate or posterior oropharyngeal erythema.  Eyes:     General: No scleral icterus.       Right eye: No discharge.        Left eye: No discharge.     Extraocular Movements: Extraocular movements intact.     Conjunctiva/sclera: Conjunctivae normal.     Pupils: Pupils are equal, round, and reactive to light.  Neck:     Thyroid: No thyromegaly.     Vascular: No carotid bruit or JVD.     Trachea: No tracheal deviation.  Cardiovascular:     Rate and Rhythm: Normal rate and regular rhythm.     Pulses:  Dorsalis pedis pulses are 3+ on the right side and 3+ on the left side.       Posterior tibial pulses are 2+ on the right side and 2+ on the left side.     Heart sounds: Normal heart sounds. No murmur heard.    No friction rub. No gallop.  Pulmonary:     Effort: Pulmonary effort is normal. No respiratory distress.     Breath sounds: Normal breath sounds. No stridor. No wheezing or rales.  Chest:     Chest wall: No tenderness.  Abdominal:     General: Bowel sounds are normal. There is no distension.     Palpations: Abdomen is soft. There is no mass.     Tenderness: There is no abdominal tenderness. There is no guarding or rebound.  Musculoskeletal:        General: No tenderness or deformity. Normal range of motion.     Cervical back: Normal range of motion and neck supple.     Right foot: Normal range of motion. No deformity, bunion, Charcot foot, foot drop or prominent metatarsal heads.     Left foot: Normal range of motion. No deformity, bunion, Charcot foot, foot drop or prominent metatarsal heads.  Feet:     Right foot:     Protective Sensation: 6 sites tested.  6 sites sensed.     Skin integrity: No ulcer, blister, skin breakdown, erythema, warmth, callus, dry skin or fissure.     Toenail Condition: Right toenails are long.     Left foot:     Protective Sensation: 6 sites tested.  6 sites sensed.     Skin integrity: No ulcer, blister, skin  breakdown, erythema, warmth, callus, dry skin or fissure.     Toenail Condition: Left toenails are long.  Lymphadenopathy:     Cervical: No cervical adenopathy.  Skin:    General: Skin is warm and dry.     Capillary Refill: Capillary refill takes less than 2 seconds.     Coloration: Skin is not pale.     Findings: No erythema or rash.  Neurological:     Mental Status: She is alert and oriented to person, place, and time.     Cranial Nerves: No cranial nerve deficit.     Motor: No abnormal muscle tone.     Coordination: Coordination normal.     Gait: Gait normal.     Deep Tendon Reflexes: Reflexes are normal and symmetric.  Psychiatric:        Mood and Affect: Mood normal.        Behavior: Behavior normal.        Thought Content: Thought content normal.        Judgment: Judgment normal.        Assessment/Plan: 1. Encounter for routine adult health examination with abnormal findings Age-appropriate preventive screenings and vaccinations discussed, annual physical exam completed. Routine labs for health maintenance ordered, see below. PHM updated.   - CBC with Differential/Platelet - CMP14+EGFR - Lipid Profile - Vitamin D  (25 hydroxy) - Hgb A1C w/o eAG - Urine Microalbumin w/creat. ratio  2. Type 2 diabetes mellitus with other specified complication, without long-term current use of insulin  (HCC) Take trulicity  as prescribed. Routine labs ordered  - Dulaglutide  (TRULICITY ) 1.5 MG/0.5ML SOAJ; Inject 1.5 mg into the skin once a week.  Dispense: 6 mL; Refill: 1 - CBC with Differential/Platelet - CMP14+EGFR - Lipid Profile - Hgb A1C w/o eAG - Urine Microalbumin w/creat. ratio  3.  Hyperlipidemia associated with type 2 diabetes mellitus (HCC) Routine labs ordered  - CBC with Differential/Platelet - CMP14+EGFR - Lipid Profile - Hgb A1C w/o eAG  4. Vitamin D  deficiency Routine lab ordered  - Vitamin D  (25 hydroxy)  5. Screen for sexually transmitted diseases  (Primary) Veginal swab sent to lab - NuSwab Vaginitis Plus (VG+)    General Counseling: Lakshmi verbalizes understanding of the findings of todays visit and agrees with plan of treatment. I have discussed any further diagnostic evaluation that may be needed or ordered today. We also reviewed her medications today. she has been encouraged to call the office with any questions or concerns that should arise related to todays visit.    Orders Placed This Encounter  Procedures   CBC with Differential/Platelet   CMP14+EGFR   Lipid Profile   Vitamin D  (25 hydroxy)   Hgb A1C w/o eAG   NuSwab Vaginitis Plus (VG+)   Urine Microalbumin w/creat. ratio    Meds ordered this encounter  Medications   Dulaglutide  (TRULICITY ) 1.5 MG/0.5ML SOAJ    Sig: Inject 1.5 mg into the skin once a week.    Dispense:  6 mL    Refill:  1    Dx code E11.65, please fill new script asap. Discontinue mounjaro     Return in about 1 month (around 09/10/2024) for F/U, eval new med, Ariyonna Twichell PCP, Labs.   Total time spent:30 Minutes Time spent includes review of chart, medications, test results, and follow up plan with the patient.   [place] Controlled Substance Database was reviewed by me.  This patient was seen by Mardy Maxin, FNP-C in collaboration with Dr. Sigrid Bathe as a part of collaborative care agreement.  Estle Sabella R. Maxin, MSN, FNP-C Internal medicine

## 2024-08-13 LAB — MICROALBUMIN / CREATININE URINE RATIO
Creatinine, Urine: 118.9 mg/dL
Microalb/Creat Ratio: <3 mg/g{creat} (ref 0–29)
Microalbumin, Urine: <3 ug/mL

## 2024-08-15 ENCOUNTER — Ambulatory Visit: Payer: MEDICAID | Admitting: Nurse Practitioner

## 2024-08-15 ENCOUNTER — Ambulatory Visit: Payer: Self-pay | Admitting: Nurse Practitioner

## 2024-08-15 DIAGNOSIS — B9689 Other specified bacterial agents as the cause of diseases classified elsewhere: Secondary | ICD-10-CM

## 2024-08-15 LAB — NUSWAB VAGINITIS PLUS (VG+)
Atopobium vaginae: HIGH {score} — AB
BVAB 2: HIGH {score} — AB
Candida albicans, NAA: NEGATIVE
Candida glabrata, NAA: NEGATIVE
Chlamydia trachomatis, NAA: NEGATIVE
Megasphaera 1: HIGH {score} — AB
Neisseria gonorrhoeae, NAA: NEGATIVE
Trich vag by NAA: NEGATIVE

## 2024-08-15 MED ORDER — METRONIDAZOLE 500 MG PO TABS: 500.0000 mg | ORAL_TABLET | Freq: Two times a day (BID) | ORAL | 0 refills | Status: AC

## 2024-08-15 NOTE — Progress Notes (Signed)
 Rebecca Ponce is positive for BV. Negative for yeast and negative for STDs. I have sent 7 days of metronidazole  twice daily to her pharmacy. Do not drink any alcohol while taking this medication.

## 2024-08-16 ENCOUNTER — Other Ambulatory Visit: Payer: Self-pay

## 2024-08-16 ENCOUNTER — Telehealth: Payer: Self-pay

## 2024-08-16 DIAGNOSIS — E1169 Type 2 diabetes mellitus with other specified complication: Secondary | ICD-10-CM

## 2024-08-16 MED ORDER — EMPAGLIFLOZIN 25 MG PO TABS: 25.0000 mg | ORAL_TABLET | Freq: Every day | ORAL | 3 refills | Status: AC

## 2024-08-16 NOTE — Telephone Encounter (Signed)
 Left message for patient to give office a call back.

## 2024-08-16 NOTE — Progress Notes (Signed)
Pt notified for result  

## 2024-09-09 ENCOUNTER — Ambulatory Visit: Payer: MEDICAID | Admitting: Nurse Practitioner

## 2025-08-14 ENCOUNTER — Encounter: Payer: MEDICAID | Admitting: Nurse Practitioner
